# Patient Record
Sex: Female | Born: 1940 | Race: White | Hispanic: No | Smoking: Current every day smoker
Health system: Southern US, Community
[De-identification: ages and names within clinical notes are randomized; demographics above are authoritative.]

## PROBLEM LIST (undated history)

## (undated) DIAGNOSIS — R413 Other amnesia: Secondary | ICD-10-CM

## (undated) DIAGNOSIS — S42009A Fracture of unspecified part of unspecified clavicle, initial encounter for closed fracture: Secondary | ICD-10-CM

## (undated) DIAGNOSIS — W19XXXA Unspecified fall, initial encounter: Secondary | ICD-10-CM

## (undated) HISTORY — DX: Fracture of unspecified part of unspecified clavicle, initial encounter for closed fracture: S42.009A

## (undated) HISTORY — DX: Unspecified fall, initial encounter: W19.XXXA

## (undated) HISTORY — PX: TUBAL LIGATION: SHX77

## (undated) HISTORY — DX: Other amnesia: R41.3

## (undated) HISTORY — PX: WRIST FRACTURE SURGERY: SHX121

---

## 2005-10-26 ENCOUNTER — Ambulatory Visit (HOSPITAL_BASED_OUTPATIENT_CLINIC_OR_DEPARTMENT_OTHER): Admission: RE | Admit: 2005-10-26 | Discharge: 2005-10-26 | Payer: Self-pay | Admitting: *Deleted

## 2011-03-07 DIAGNOSIS — L408 Other psoriasis: Secondary | ICD-10-CM | POA: Diagnosis not present

## 2011-03-07 DIAGNOSIS — IMO0002 Reserved for concepts with insufficient information to code with codable children: Secondary | ICD-10-CM | POA: Diagnosis not present

## 2011-03-07 DIAGNOSIS — F17200 Nicotine dependence, unspecified, uncomplicated: Secondary | ICD-10-CM | POA: Diagnosis not present

## 2011-03-07 DIAGNOSIS — F411 Generalized anxiety disorder: Secondary | ICD-10-CM | POA: Diagnosis not present

## 2011-05-17 DIAGNOSIS — R928 Other abnormal and inconclusive findings on diagnostic imaging of breast: Secondary | ICD-10-CM | POA: Diagnosis not present

## 2011-11-15 DIAGNOSIS — IMO0002 Reserved for concepts with insufficient information to code with codable children: Secondary | ICD-10-CM | POA: Diagnosis not present

## 2011-11-15 DIAGNOSIS — Z23 Encounter for immunization: Secondary | ICD-10-CM | POA: Diagnosis not present

## 2011-11-15 DIAGNOSIS — F411 Generalized anxiety disorder: Secondary | ICD-10-CM | POA: Diagnosis not present

## 2012-05-01 DIAGNOSIS — F411 Generalized anxiety disorder: Secondary | ICD-10-CM | POA: Diagnosis not present

## 2012-05-17 DIAGNOSIS — Z1231 Encounter for screening mammogram for malignant neoplasm of breast: Secondary | ICD-10-CM | POA: Diagnosis not present

## 2012-08-17 DIAGNOSIS — F17200 Nicotine dependence, unspecified, uncomplicated: Secondary | ICD-10-CM | POA: Diagnosis not present

## 2012-08-17 DIAGNOSIS — S91009A Unspecified open wound, unspecified ankle, initial encounter: Secondary | ICD-10-CM | POA: Diagnosis not present

## 2012-08-17 DIAGNOSIS — Z79899 Other long term (current) drug therapy: Secondary | ICD-10-CM | POA: Diagnosis not present

## 2012-08-17 DIAGNOSIS — S81009A Unspecified open wound, unspecified knee, initial encounter: Secondary | ICD-10-CM | POA: Diagnosis not present

## 2012-11-07 DIAGNOSIS — F411 Generalized anxiety disorder: Secondary | ICD-10-CM | POA: Diagnosis not present

## 2012-11-07 DIAGNOSIS — Z23 Encounter for immunization: Secondary | ICD-10-CM | POA: Diagnosis not present

## 2012-11-07 DIAGNOSIS — F17200 Nicotine dependence, unspecified, uncomplicated: Secondary | ICD-10-CM | POA: Diagnosis not present

## 2013-01-21 DIAGNOSIS — F17200 Nicotine dependence, unspecified, uncomplicated: Secondary | ICD-10-CM | POA: Diagnosis not present

## 2013-01-21 DIAGNOSIS — G4700 Insomnia, unspecified: Secondary | ICD-10-CM | POA: Diagnosis not present

## 2013-01-21 DIAGNOSIS — F411 Generalized anxiety disorder: Secondary | ICD-10-CM | POA: Diagnosis not present

## 2013-01-21 DIAGNOSIS — R3915 Urgency of urination: Secondary | ICD-10-CM | POA: Diagnosis not present

## 2013-04-23 DIAGNOSIS — F17200 Nicotine dependence, unspecified, uncomplicated: Secondary | ICD-10-CM | POA: Diagnosis not present

## 2013-04-23 DIAGNOSIS — IMO0002 Reserved for concepts with insufficient information to code with codable children: Secondary | ICD-10-CM | POA: Diagnosis not present

## 2013-04-23 DIAGNOSIS — F411 Generalized anxiety disorder: Secondary | ICD-10-CM | POA: Diagnosis not present

## 2013-06-12 DIAGNOSIS — Z1231 Encounter for screening mammogram for malignant neoplasm of breast: Secondary | ICD-10-CM | POA: Diagnosis not present

## 2013-06-25 DIAGNOSIS — L408 Other psoriasis: Secondary | ICD-10-CM | POA: Diagnosis not present

## 2013-08-27 DIAGNOSIS — IMO0002 Reserved for concepts with insufficient information to code with codable children: Secondary | ICD-10-CM | POA: Diagnosis not present

## 2013-08-27 DIAGNOSIS — G4700 Insomnia, unspecified: Secondary | ICD-10-CM | POA: Diagnosis not present

## 2013-08-27 DIAGNOSIS — F17200 Nicotine dependence, unspecified, uncomplicated: Secondary | ICD-10-CM | POA: Diagnosis not present

## 2013-08-27 DIAGNOSIS — L408 Other psoriasis: Secondary | ICD-10-CM | POA: Diagnosis not present

## 2013-08-27 DIAGNOSIS — N3000 Acute cystitis without hematuria: Secondary | ICD-10-CM | POA: Diagnosis not present

## 2013-08-27 DIAGNOSIS — F411 Generalized anxiety disorder: Secondary | ICD-10-CM | POA: Diagnosis not present

## 2013-09-03 DIAGNOSIS — G4700 Insomnia, unspecified: Secondary | ICD-10-CM | POA: Diagnosis not present

## 2013-09-03 DIAGNOSIS — N189 Chronic kidney disease, unspecified: Secondary | ICD-10-CM | POA: Diagnosis not present

## 2013-09-03 DIAGNOSIS — F411 Generalized anxiety disorder: Secondary | ICD-10-CM | POA: Diagnosis not present

## 2013-09-03 DIAGNOSIS — F17200 Nicotine dependence, unspecified, uncomplicated: Secondary | ICD-10-CM | POA: Diagnosis not present

## 2014-01-22 DIAGNOSIS — Z1389 Encounter for screening for other disorder: Secondary | ICD-10-CM | POA: Diagnosis not present

## 2014-01-22 DIAGNOSIS — F324 Major depressive disorder, single episode, in partial remission: Secondary | ICD-10-CM | POA: Diagnosis not present

## 2014-01-22 DIAGNOSIS — F411 Generalized anxiety disorder: Secondary | ICD-10-CM | POA: Diagnosis not present

## 2014-01-22 DIAGNOSIS — Z720 Tobacco use: Secondary | ICD-10-CM | POA: Diagnosis not present

## 2014-01-22 DIAGNOSIS — F5101 Primary insomnia: Secondary | ICD-10-CM | POA: Diagnosis not present

## 2014-05-22 DIAGNOSIS — F324 Major depressive disorder, single episode, in partial remission: Secondary | ICD-10-CM | POA: Diagnosis not present

## 2014-05-22 DIAGNOSIS — F411 Generalized anxiety disorder: Secondary | ICD-10-CM | POA: Diagnosis not present

## 2014-05-22 DIAGNOSIS — F5101 Primary insomnia: Secondary | ICD-10-CM | POA: Diagnosis not present

## 2014-05-22 DIAGNOSIS — Z23 Encounter for immunization: Secondary | ICD-10-CM | POA: Diagnosis not present

## 2014-06-16 DIAGNOSIS — Z1231 Encounter for screening mammogram for malignant neoplasm of breast: Secondary | ICD-10-CM | POA: Diagnosis not present

## 2014-11-05 DIAGNOSIS — Z23 Encounter for immunization: Secondary | ICD-10-CM | POA: Diagnosis not present

## 2014-11-05 DIAGNOSIS — F5101 Primary insomnia: Secondary | ICD-10-CM | POA: Diagnosis not present

## 2014-11-05 DIAGNOSIS — F411 Generalized anxiety disorder: Secondary | ICD-10-CM | POA: Diagnosis not present

## 2014-11-05 DIAGNOSIS — F324 Major depressive disorder, single episode, in partial remission: Secondary | ICD-10-CM | POA: Diagnosis not present

## 2015-01-02 DIAGNOSIS — S6991XA Unspecified injury of right wrist, hand and finger(s), initial encounter: Secondary | ICD-10-CM | POA: Diagnosis not present

## 2015-01-02 DIAGNOSIS — S62396A Other fracture of fifth metacarpal bone, right hand, initial encounter for closed fracture: Secondary | ICD-10-CM | POA: Diagnosis not present

## 2015-01-02 DIAGNOSIS — S62306A Unspecified fracture of fifth metacarpal bone, right hand, initial encounter for closed fracture: Secondary | ICD-10-CM | POA: Diagnosis not present

## 2015-01-02 DIAGNOSIS — X58XXXA Exposure to other specified factors, initial encounter: Secondary | ICD-10-CM | POA: Diagnosis not present

## 2015-01-02 DIAGNOSIS — F17200 Nicotine dependence, unspecified, uncomplicated: Secondary | ICD-10-CM | POA: Diagnosis not present

## 2015-01-02 DIAGNOSIS — M79641 Pain in right hand: Secondary | ICD-10-CM | POA: Diagnosis not present

## 2015-01-09 DIAGNOSIS — S62356A Nondisplaced fracture of shaft of fifth metacarpal bone, right hand, initial encounter for closed fracture: Secondary | ICD-10-CM | POA: Diagnosis not present

## 2015-01-30 DIAGNOSIS — S62356A Nondisplaced fracture of shaft of fifth metacarpal bone, right hand, initial encounter for closed fracture: Secondary | ICD-10-CM | POA: Diagnosis not present

## 2015-04-24 DIAGNOSIS — F411 Generalized anxiety disorder: Secondary | ICD-10-CM | POA: Diagnosis not present

## 2015-04-24 DIAGNOSIS — F324 Major depressive disorder, single episode, in partial remission: Secondary | ICD-10-CM | POA: Diagnosis not present

## 2015-04-24 DIAGNOSIS — N189 Chronic kidney disease, unspecified: Secondary | ICD-10-CM | POA: Diagnosis not present

## 2015-04-24 DIAGNOSIS — Z720 Tobacco use: Secondary | ICD-10-CM | POA: Diagnosis not present

## 2015-04-27 DIAGNOSIS — F5101 Primary insomnia: Secondary | ICD-10-CM | POA: Diagnosis not present

## 2015-04-27 DIAGNOSIS — F324 Major depressive disorder, single episode, in partial remission: Secondary | ICD-10-CM | POA: Diagnosis not present

## 2015-04-27 DIAGNOSIS — F411 Generalized anxiety disorder: Secondary | ICD-10-CM | POA: Diagnosis not present

## 2015-10-16 ENCOUNTER — Other Ambulatory Visit: Payer: Self-pay

## 2015-11-05 DIAGNOSIS — Z681 Body mass index (BMI) 19 or less, adult: Secondary | ICD-10-CM | POA: Diagnosis not present

## 2015-11-05 DIAGNOSIS — F324 Major depressive disorder, single episode, in partial remission: Secondary | ICD-10-CM | POA: Diagnosis not present

## 2015-11-05 DIAGNOSIS — Z23 Encounter for immunization: Secondary | ICD-10-CM | POA: Diagnosis not present

## 2015-11-05 DIAGNOSIS — F5101 Primary insomnia: Secondary | ICD-10-CM | POA: Diagnosis not present

## 2015-11-05 DIAGNOSIS — F411 Generalized anxiety disorder: Secondary | ICD-10-CM | POA: Diagnosis not present

## 2016-03-01 DIAGNOSIS — F0390 Unspecified dementia without behavioral disturbance: Secondary | ICD-10-CM | POA: Diagnosis not present

## 2016-03-01 DIAGNOSIS — Z681 Body mass index (BMI) 19 or less, adult: Secondary | ICD-10-CM | POA: Diagnosis not present

## 2016-03-01 DIAGNOSIS — E441 Mild protein-calorie malnutrition: Secondary | ICD-10-CM | POA: Diagnosis not present

## 2016-04-29 DIAGNOSIS — R918 Other nonspecific abnormal finding of lung field: Secondary | ICD-10-CM | POA: Diagnosis not present

## 2016-04-29 DIAGNOSIS — G9341 Metabolic encephalopathy: Secondary | ICD-10-CM | POA: Diagnosis not present

## 2016-04-29 DIAGNOSIS — Z886 Allergy status to analgesic agent status: Secondary | ICD-10-CM | POA: Diagnosis not present

## 2016-04-29 DIAGNOSIS — R410 Disorientation, unspecified: Secondary | ICD-10-CM | POA: Diagnosis not present

## 2016-04-29 DIAGNOSIS — R319 Hematuria, unspecified: Secondary | ICD-10-CM | POA: Diagnosis not present

## 2016-04-29 DIAGNOSIS — B9620 Unspecified Escherichia coli [E. coli] as the cause of diseases classified elsewhere: Secondary | ICD-10-CM | POA: Diagnosis present

## 2016-04-29 DIAGNOSIS — F0281 Dementia in other diseases classified elsewhere with behavioral disturbance: Secondary | ICD-10-CM | POA: Diagnosis not present

## 2016-04-29 DIAGNOSIS — E860 Dehydration: Secondary | ICD-10-CM | POA: Diagnosis not present

## 2016-04-29 DIAGNOSIS — Z885 Allergy status to narcotic agent status: Secondary | ICD-10-CM | POA: Diagnosis not present

## 2016-04-29 DIAGNOSIS — F0390 Unspecified dementia without behavioral disturbance: Secondary | ICD-10-CM | POA: Diagnosis present

## 2016-04-29 DIAGNOSIS — F05 Delirium due to known physiological condition: Secondary | ICD-10-CM | POA: Diagnosis not present

## 2016-04-29 DIAGNOSIS — N390 Urinary tract infection, site not specified: Secondary | ICD-10-CM | POA: Diagnosis not present

## 2016-04-29 DIAGNOSIS — R0902 Hypoxemia: Secondary | ICD-10-CM | POA: Diagnosis not present

## 2016-05-08 DIAGNOSIS — G9341 Metabolic encephalopathy: Secondary | ICD-10-CM | POA: Diagnosis not present

## 2016-05-08 DIAGNOSIS — F0390 Unspecified dementia without behavioral disturbance: Secondary | ICD-10-CM | POA: Diagnosis not present

## 2016-05-08 DIAGNOSIS — E860 Dehydration: Secondary | ICD-10-CM | POA: Diagnosis not present

## 2016-05-08 DIAGNOSIS — N390 Urinary tract infection, site not specified: Secondary | ICD-10-CM | POA: Diagnosis not present

## 2016-05-08 DIAGNOSIS — R2689 Other abnormalities of gait and mobility: Secondary | ICD-10-CM | POA: Diagnosis not present

## 2016-05-10 DIAGNOSIS — F0390 Unspecified dementia without behavioral disturbance: Secondary | ICD-10-CM | POA: Diagnosis not present

## 2016-05-10 DIAGNOSIS — N390 Urinary tract infection, site not specified: Secondary | ICD-10-CM | POA: Diagnosis not present

## 2016-05-10 DIAGNOSIS — R2689 Other abnormalities of gait and mobility: Secondary | ICD-10-CM | POA: Diagnosis not present

## 2016-05-10 DIAGNOSIS — G9341 Metabolic encephalopathy: Secondary | ICD-10-CM | POA: Diagnosis not present

## 2016-05-10 DIAGNOSIS — E860 Dehydration: Secondary | ICD-10-CM | POA: Diagnosis not present

## 2016-05-11 DIAGNOSIS — F0390 Unspecified dementia without behavioral disturbance: Secondary | ICD-10-CM | POA: Diagnosis not present

## 2016-05-11 DIAGNOSIS — G9341 Metabolic encephalopathy: Secondary | ICD-10-CM | POA: Diagnosis not present

## 2016-05-11 DIAGNOSIS — R2689 Other abnormalities of gait and mobility: Secondary | ICD-10-CM | POA: Diagnosis not present

## 2016-05-11 DIAGNOSIS — E860 Dehydration: Secondary | ICD-10-CM | POA: Diagnosis not present

## 2016-05-11 DIAGNOSIS — N390 Urinary tract infection, site not specified: Secondary | ICD-10-CM | POA: Diagnosis not present

## 2016-05-12 DIAGNOSIS — F0390 Unspecified dementia without behavioral disturbance: Secondary | ICD-10-CM | POA: Diagnosis not present

## 2016-05-12 DIAGNOSIS — R2689 Other abnormalities of gait and mobility: Secondary | ICD-10-CM | POA: Diagnosis not present

## 2016-05-12 DIAGNOSIS — N390 Urinary tract infection, site not specified: Secondary | ICD-10-CM | POA: Diagnosis not present

## 2016-05-12 DIAGNOSIS — E860 Dehydration: Secondary | ICD-10-CM | POA: Diagnosis not present

## 2016-05-12 DIAGNOSIS — G9341 Metabolic encephalopathy: Secondary | ICD-10-CM | POA: Diagnosis not present

## 2016-05-13 DIAGNOSIS — N390 Urinary tract infection, site not specified: Secondary | ICD-10-CM | POA: Diagnosis not present

## 2016-05-13 DIAGNOSIS — F0390 Unspecified dementia without behavioral disturbance: Secondary | ICD-10-CM | POA: Diagnosis not present

## 2016-05-13 DIAGNOSIS — R413 Other amnesia: Secondary | ICD-10-CM | POA: Diagnosis not present

## 2016-05-13 DIAGNOSIS — R2689 Other abnormalities of gait and mobility: Secondary | ICD-10-CM | POA: Diagnosis not present

## 2016-05-13 DIAGNOSIS — G9389 Other specified disorders of brain: Secondary | ICD-10-CM | POA: Diagnosis not present

## 2016-05-13 DIAGNOSIS — G9341 Metabolic encephalopathy: Secondary | ICD-10-CM | POA: Diagnosis not present

## 2016-05-13 DIAGNOSIS — R410 Disorientation, unspecified: Secondary | ICD-10-CM | POA: Diagnosis not present

## 2016-05-13 DIAGNOSIS — E860 Dehydration: Secondary | ICD-10-CM | POA: Diagnosis not present

## 2016-05-17 DIAGNOSIS — E860 Dehydration: Secondary | ICD-10-CM | POA: Diagnosis not present

## 2016-05-17 DIAGNOSIS — F0390 Unspecified dementia without behavioral disturbance: Secondary | ICD-10-CM | POA: Diagnosis not present

## 2016-05-17 DIAGNOSIS — G9341 Metabolic encephalopathy: Secondary | ICD-10-CM | POA: Diagnosis not present

## 2016-05-17 DIAGNOSIS — R2689 Other abnormalities of gait and mobility: Secondary | ICD-10-CM | POA: Diagnosis not present

## 2016-05-17 DIAGNOSIS — N390 Urinary tract infection, site not specified: Secondary | ICD-10-CM | POA: Diagnosis not present

## 2016-05-18 DIAGNOSIS — E860 Dehydration: Secondary | ICD-10-CM | POA: Diagnosis not present

## 2016-05-18 DIAGNOSIS — F0390 Unspecified dementia without behavioral disturbance: Secondary | ICD-10-CM | POA: Diagnosis not present

## 2016-05-18 DIAGNOSIS — G9341 Metabolic encephalopathy: Secondary | ICD-10-CM | POA: Diagnosis not present

## 2016-05-18 DIAGNOSIS — R2689 Other abnormalities of gait and mobility: Secondary | ICD-10-CM | POA: Diagnosis not present

## 2016-05-18 DIAGNOSIS — N390 Urinary tract infection, site not specified: Secondary | ICD-10-CM | POA: Diagnosis not present

## 2016-05-19 DIAGNOSIS — R2689 Other abnormalities of gait and mobility: Secondary | ICD-10-CM | POA: Diagnosis not present

## 2016-05-19 DIAGNOSIS — Z681 Body mass index (BMI) 19 or less, adult: Secondary | ICD-10-CM | POA: Diagnosis not present

## 2016-05-19 DIAGNOSIS — N390 Urinary tract infection, site not specified: Secondary | ICD-10-CM | POA: Diagnosis not present

## 2016-05-19 DIAGNOSIS — F0281 Dementia in other diseases classified elsewhere with behavioral disturbance: Secondary | ICD-10-CM | POA: Diagnosis not present

## 2016-05-19 DIAGNOSIS — K5901 Slow transit constipation: Secondary | ICD-10-CM | POA: Diagnosis not present

## 2016-05-19 DIAGNOSIS — G9341 Metabolic encephalopathy: Secondary | ICD-10-CM | POA: Diagnosis not present

## 2016-05-19 DIAGNOSIS — G912 (Idiopathic) normal pressure hydrocephalus: Secondary | ICD-10-CM | POA: Diagnosis not present

## 2016-05-19 DIAGNOSIS — F0390 Unspecified dementia without behavioral disturbance: Secondary | ICD-10-CM | POA: Diagnosis not present

## 2016-05-19 DIAGNOSIS — E860 Dehydration: Secondary | ICD-10-CM | POA: Diagnosis not present

## 2016-05-19 DIAGNOSIS — F5101 Primary insomnia: Secondary | ICD-10-CM | POA: Diagnosis not present

## 2016-05-19 DIAGNOSIS — F411 Generalized anxiety disorder: Secondary | ICD-10-CM | POA: Diagnosis not present

## 2016-05-20 DIAGNOSIS — F0390 Unspecified dementia without behavioral disturbance: Secondary | ICD-10-CM | POA: Diagnosis not present

## 2016-05-20 DIAGNOSIS — R2689 Other abnormalities of gait and mobility: Secondary | ICD-10-CM | POA: Diagnosis not present

## 2016-05-20 DIAGNOSIS — N390 Urinary tract infection, site not specified: Secondary | ICD-10-CM | POA: Diagnosis not present

## 2016-05-20 DIAGNOSIS — G9341 Metabolic encephalopathy: Secondary | ICD-10-CM | POA: Diagnosis not present

## 2016-05-20 DIAGNOSIS — E860 Dehydration: Secondary | ICD-10-CM | POA: Diagnosis not present

## 2016-05-23 DIAGNOSIS — N390 Urinary tract infection, site not specified: Secondary | ICD-10-CM | POA: Diagnosis not present

## 2016-05-23 DIAGNOSIS — R2689 Other abnormalities of gait and mobility: Secondary | ICD-10-CM | POA: Diagnosis not present

## 2016-05-23 DIAGNOSIS — F0390 Unspecified dementia without behavioral disturbance: Secondary | ICD-10-CM | POA: Diagnosis not present

## 2016-05-23 DIAGNOSIS — G9341 Metabolic encephalopathy: Secondary | ICD-10-CM | POA: Diagnosis not present

## 2016-05-23 DIAGNOSIS — E860 Dehydration: Secondary | ICD-10-CM | POA: Diagnosis not present

## 2016-05-24 DIAGNOSIS — E860 Dehydration: Secondary | ICD-10-CM | POA: Diagnosis not present

## 2016-05-24 DIAGNOSIS — F0390 Unspecified dementia without behavioral disturbance: Secondary | ICD-10-CM | POA: Diagnosis not present

## 2016-05-24 DIAGNOSIS — R2689 Other abnormalities of gait and mobility: Secondary | ICD-10-CM | POA: Diagnosis not present

## 2016-05-24 DIAGNOSIS — G9341 Metabolic encephalopathy: Secondary | ICD-10-CM | POA: Diagnosis not present

## 2016-05-24 DIAGNOSIS — N390 Urinary tract infection, site not specified: Secondary | ICD-10-CM | POA: Diagnosis not present

## 2016-05-25 DIAGNOSIS — G9341 Metabolic encephalopathy: Secondary | ICD-10-CM | POA: Diagnosis not present

## 2016-05-25 DIAGNOSIS — R2689 Other abnormalities of gait and mobility: Secondary | ICD-10-CM | POA: Diagnosis not present

## 2016-05-25 DIAGNOSIS — F0390 Unspecified dementia without behavioral disturbance: Secondary | ICD-10-CM | POA: Diagnosis not present

## 2016-05-25 DIAGNOSIS — N390 Urinary tract infection, site not specified: Secondary | ICD-10-CM | POA: Diagnosis not present

## 2016-05-25 DIAGNOSIS — E860 Dehydration: Secondary | ICD-10-CM | POA: Diagnosis not present

## 2016-05-26 DIAGNOSIS — N390 Urinary tract infection, site not specified: Secondary | ICD-10-CM | POA: Diagnosis not present

## 2016-05-26 DIAGNOSIS — R2689 Other abnormalities of gait and mobility: Secondary | ICD-10-CM | POA: Diagnosis not present

## 2016-05-26 DIAGNOSIS — F0390 Unspecified dementia without behavioral disturbance: Secondary | ICD-10-CM | POA: Diagnosis not present

## 2016-05-26 DIAGNOSIS — E860 Dehydration: Secondary | ICD-10-CM | POA: Diagnosis not present

## 2016-05-26 DIAGNOSIS — G9341 Metabolic encephalopathy: Secondary | ICD-10-CM | POA: Diagnosis not present

## 2016-05-27 DIAGNOSIS — M19041 Primary osteoarthritis, right hand: Secondary | ICD-10-CM | POA: Diagnosis not present

## 2016-05-27 DIAGNOSIS — G9341 Metabolic encephalopathy: Secondary | ICD-10-CM | POA: Diagnosis not present

## 2016-05-27 DIAGNOSIS — E860 Dehydration: Secondary | ICD-10-CM | POA: Diagnosis not present

## 2016-05-27 DIAGNOSIS — F0390 Unspecified dementia without behavioral disturbance: Secondary | ICD-10-CM | POA: Diagnosis not present

## 2016-05-27 DIAGNOSIS — N390 Urinary tract infection, site not specified: Secondary | ICD-10-CM | POA: Diagnosis not present

## 2016-05-27 DIAGNOSIS — F17200 Nicotine dependence, unspecified, uncomplicated: Secondary | ICD-10-CM | POA: Diagnosis not present

## 2016-05-27 DIAGNOSIS — R2689 Other abnormalities of gait and mobility: Secondary | ICD-10-CM | POA: Diagnosis not present

## 2016-05-27 DIAGNOSIS — Z79899 Other long term (current) drug therapy: Secondary | ICD-10-CM | POA: Diagnosis not present

## 2016-05-27 DIAGNOSIS — W228XXA Striking against or struck by other objects, initial encounter: Secondary | ICD-10-CM | POA: Diagnosis not present

## 2016-05-27 DIAGNOSIS — S60051A Contusion of right little finger without damage to nail, initial encounter: Secondary | ICD-10-CM | POA: Diagnosis not present

## 2016-05-30 DIAGNOSIS — G9341 Metabolic encephalopathy: Secondary | ICD-10-CM | POA: Diagnosis not present

## 2016-05-30 DIAGNOSIS — R2689 Other abnormalities of gait and mobility: Secondary | ICD-10-CM | POA: Diagnosis not present

## 2016-05-30 DIAGNOSIS — F0390 Unspecified dementia without behavioral disturbance: Secondary | ICD-10-CM | POA: Diagnosis not present

## 2016-05-30 DIAGNOSIS — E860 Dehydration: Secondary | ICD-10-CM | POA: Diagnosis not present

## 2016-05-30 DIAGNOSIS — N390 Urinary tract infection, site not specified: Secondary | ICD-10-CM | POA: Diagnosis not present

## 2016-05-31 DIAGNOSIS — F0390 Unspecified dementia without behavioral disturbance: Secondary | ICD-10-CM | POA: Diagnosis not present

## 2016-05-31 DIAGNOSIS — R2689 Other abnormalities of gait and mobility: Secondary | ICD-10-CM | POA: Diagnosis not present

## 2016-05-31 DIAGNOSIS — G9341 Metabolic encephalopathy: Secondary | ICD-10-CM | POA: Diagnosis not present

## 2016-05-31 DIAGNOSIS — E860 Dehydration: Secondary | ICD-10-CM | POA: Diagnosis not present

## 2016-05-31 DIAGNOSIS — N390 Urinary tract infection, site not specified: Secondary | ICD-10-CM | POA: Diagnosis not present

## 2016-06-02 ENCOUNTER — Ambulatory Visit: Payer: Medicare Other | Admitting: Neurology

## 2016-06-02 DIAGNOSIS — I6789 Other cerebrovascular disease: Secondary | ICD-10-CM | POA: Diagnosis not present

## 2016-06-02 DIAGNOSIS — R9431 Abnormal electrocardiogram [ECG] [EKG]: Secondary | ICD-10-CM | POA: Diagnosis not present

## 2016-06-02 DIAGNOSIS — R4182 Altered mental status, unspecified: Secondary | ICD-10-CM | POA: Diagnosis not present

## 2016-06-02 DIAGNOSIS — G9341 Metabolic encephalopathy: Secondary | ICD-10-CM | POA: Diagnosis not present

## 2016-06-02 DIAGNOSIS — F4489 Other dissociative and conversion disorders: Secondary | ICD-10-CM | POA: Diagnosis not present

## 2016-06-02 DIAGNOSIS — E860 Dehydration: Secondary | ICD-10-CM | POA: Diagnosis not present

## 2016-06-02 DIAGNOSIS — N390 Urinary tract infection, site not specified: Secondary | ICD-10-CM | POA: Diagnosis not present

## 2016-06-02 DIAGNOSIS — F0390 Unspecified dementia without behavioral disturbance: Secondary | ICD-10-CM | POA: Diagnosis not present

## 2016-06-02 DIAGNOSIS — R2689 Other abnormalities of gait and mobility: Secondary | ICD-10-CM | POA: Diagnosis not present

## 2016-06-02 DIAGNOSIS — F17200 Nicotine dependence, unspecified, uncomplicated: Secondary | ICD-10-CM | POA: Diagnosis not present

## 2016-06-02 DIAGNOSIS — S0990XA Unspecified injury of head, initial encounter: Secondary | ICD-10-CM | POA: Diagnosis not present

## 2016-06-02 DIAGNOSIS — Z79899 Other long term (current) drug therapy: Secondary | ICD-10-CM | POA: Diagnosis not present

## 2016-06-05 DIAGNOSIS — G301 Alzheimer's disease with late onset: Secondary | ICD-10-CM | POA: Diagnosis not present

## 2016-06-05 DIAGNOSIS — F05 Delirium due to known physiological condition: Secondary | ICD-10-CM | POA: Diagnosis not present

## 2016-06-05 DIAGNOSIS — Z681 Body mass index (BMI) 19 or less, adult: Secondary | ICD-10-CM | POA: Diagnosis not present

## 2016-06-06 DIAGNOSIS — R2689 Other abnormalities of gait and mobility: Secondary | ICD-10-CM | POA: Diagnosis not present

## 2016-06-06 DIAGNOSIS — N390 Urinary tract infection, site not specified: Secondary | ICD-10-CM | POA: Diagnosis not present

## 2016-06-06 DIAGNOSIS — E860 Dehydration: Secondary | ICD-10-CM | POA: Diagnosis not present

## 2016-06-06 DIAGNOSIS — F0390 Unspecified dementia without behavioral disturbance: Secondary | ICD-10-CM | POA: Diagnosis not present

## 2016-06-06 DIAGNOSIS — G9341 Metabolic encephalopathy: Secondary | ICD-10-CM | POA: Diagnosis not present

## 2016-06-07 ENCOUNTER — Ambulatory Visit: Payer: Medicare Other | Admitting: Neurology

## 2016-06-15 DIAGNOSIS — E860 Dehydration: Secondary | ICD-10-CM | POA: Diagnosis not present

## 2016-06-15 DIAGNOSIS — F0390 Unspecified dementia without behavioral disturbance: Secondary | ICD-10-CM | POA: Diagnosis not present

## 2016-06-15 DIAGNOSIS — R2689 Other abnormalities of gait and mobility: Secondary | ICD-10-CM | POA: Diagnosis not present

## 2016-06-15 DIAGNOSIS — N390 Urinary tract infection, site not specified: Secondary | ICD-10-CM | POA: Diagnosis not present

## 2016-06-15 DIAGNOSIS — G9341 Metabolic encephalopathy: Secondary | ICD-10-CM | POA: Diagnosis not present

## 2016-06-17 DIAGNOSIS — N390 Urinary tract infection, site not specified: Secondary | ICD-10-CM | POA: Diagnosis not present

## 2016-06-17 DIAGNOSIS — E860 Dehydration: Secondary | ICD-10-CM | POA: Diagnosis not present

## 2016-06-17 DIAGNOSIS — R2689 Other abnormalities of gait and mobility: Secondary | ICD-10-CM | POA: Diagnosis not present

## 2016-06-17 DIAGNOSIS — F0390 Unspecified dementia without behavioral disturbance: Secondary | ICD-10-CM | POA: Diagnosis not present

## 2016-06-17 DIAGNOSIS — G9341 Metabolic encephalopathy: Secondary | ICD-10-CM | POA: Diagnosis not present

## 2016-06-21 ENCOUNTER — Encounter (INDEPENDENT_AMBULATORY_CARE_PROVIDER_SITE_OTHER): Payer: Self-pay

## 2016-06-21 ENCOUNTER — Ambulatory Visit (INDEPENDENT_AMBULATORY_CARE_PROVIDER_SITE_OTHER): Payer: Medicare Other | Admitting: Neurology

## 2016-06-21 ENCOUNTER — Encounter: Payer: Self-pay | Admitting: Neurology

## 2016-06-21 VITALS — BP 138/73 | HR 72 | Ht 62.0 in | Wt 108.6 lb

## 2016-06-21 DIAGNOSIS — G912 (Idiopathic) normal pressure hydrocephalus: Secondary | ICD-10-CM | POA: Diagnosis not present

## 2016-06-21 NOTE — Progress Notes (Signed)
[facility]    Provider:  Dr Jaynee Eagles Referring Provider: Manon Hilding, MD Primary Care Physician:  Manon Hilding, MD  CC:  Dementia  HPI:  Debbie Hawkins is a 76 y.o. female here as a referral from Dr. Quintin Alto for dementia. MRI of the brain showed marketed ventriculomegaly. She is on Aricept. She is a current every day smoker. Past medical history of anxiety and depression.Patient is here for son and daughter. Patient says she can't remember things doesn't know when it started. She can't give anymore details. Here with a friend and her son. Her gait is impaired she has difficulty going up steps. She tells stories of things that did not happen. She gets people mixed up, she thought her primary doctor was a Environmental education officer. She often mistakes her son for her brother. She has periods of agitation, being mean. Symptoms started a year ago, progressive. She went to the hospital with a UTI 2 months ago and since then worse. She wsa very agitated in the hospital and needed sedation or a 24x7 sitter. They started Geodon in the hospital before she had the MRI. She can sign her name to a check but can't manage finances.  She lives in a home. Son is there all day long. She has difficulty using the microwave and seeing the numbers.  She has delusions, she thinks someone else is living with them. She obsesses over the dog. She has difficulty with the date, repeating the same stories or questions in 5 minutes. She thinks she has to perform tasks   Reviewed notes, labs and imaging from outside physicians, which showed:  Reviewed MRI report and also images brought on CD by family and agree with the following. No acute stroke, acute hemorrhage, mass lesion, or extra-axial fluid. Marked ventriculomegaly, particularly affecting the lateral and third ventricles. His disproportionate lack of enlargement of the cortical sulci. Normal pressure hydrocephalus is not excluded. In addition to the findings which are  consistent with chronic microvascular ischemic change, there is fairly continuous T2 FLAIR hyperintensity surrounding the ventricles which could represent transependymal absorption. Partially empty sella. Impression marked ventriculomegaly could represent normal pressure hydrocephalus. Chronic microvascular ischemic changes may be superimposed. There is a possibility of cortical atrophy. Consider lumbar puncture for further evaluation.  CMP 03/01/2016 with BUN 12 and creatinine 1.07, EGFR slightly reduced. CBC was unremarkable. TSH 3.2. RPR nonreactive. B12 and folate normal, B12 686.  Reviewed notes from [facility]. It appears as though she was admitted for Escherichia coli UTI,   Review of Systems: Patient complains of symptoms per HPI as well as the following symptoms: memory changes, confusion. Pertinent negatives per HPI. All others negative.   Social History   Social History  . Marital status: Divorced    Spouse name: N/A  . Number of children: 2  . Years of education: Nursing school   Occupational History  . Retired    Social History Main Topics  . Smoking status: Current Every Day Smoker    Packs/day: 1.50  . Smokeless tobacco: Current User  . Alcohol use No  . Drug use: No  . Sexual activity: Not on file   Other Topics Concern  . Not on file   Social History Narrative   Lives w/ her son   Caffeine: 2 cups per day    Family History  Problem Relation Age of Onset  . Breast cancer Mother   . Stroke Father   . Dementia Neg Hx  Past Medical History:  Diagnosis Date  . Memory loss     Past Surgical History:  Procedure Laterality Date  . TUBAL LIGATION      Current Outpatient Prescriptions  Medication Sig Dispense Refill  . amitriptyline (ELAVIL) 25 MG tablet Take 25 mg by mouth at bedtime.  3  . donepezil (ARICEPT) 10 MG tablet Take 10 mg by mouth daily.  6  . ziprasidone (GEODON) 20 MG capsule Take 20 mg by mouth 2 (two) times daily.  0     No current facility-administered medications for this visit.     Allergies as of 06/21/2016  . (No Known Allergies)    Vitals: BP 138/73   Pulse 72   Ht 5' 2"  (1.575 m)   Wt 108 lb 9.6 oz (49.3 kg)   BMI 19.86 kg/m  Last Weight:  Wt Readings from Last 1 Encounters:  06/21/16 108 lb 9.6 oz (49.3 kg)   Last Height:   Ht Readings from Last 1 Encounters:  06/21/16 5' 2"  (1.575 m)   Physical exam: Exam: Gen: NAD, not converant          CV: RRR, no MRG. No Carotid Bruits. No peripheral edema, warm, nontender Eyes: Conjunctivae clear without exudates or hemorrhage  Neuro: Detailed Neurologic Exam  Speech:    Speech is normal; fluent, not spontaneous with impaired comprehension.  Difficulty following simple commands Cognition:  MMSE - Mini Mental State Exam 06/21/2016  Orientation to time 3  Orientation to Place 2  Registration 3  Attention/ Calculation 4  Recall 0  Language- name 2 objects 2  Language- repeat 1  Language- follow 3 step command 2  Language- read & follow direction 0  Write a sentence 0  Copy design 0  Total score 17      The patient is oriented to person     recent and remote memory impaired;     language fluent;     Impaired attention, concentration, fund of knowledge Cranial Nerves:    The pupils are equal, round, and reactive to light. Attempted funduscopic exam could not visualize due to patient cooperation.. Visual fields are full to finger confrontation. Exotropia Extraocular movements are intact. Trigeminal sensation is intact and the muscles of mastication are normal. The face is symmetric. The palate elevates in the midline. Hearing intact. Voice is normal. Shoulder shrug is normal. The tongue has normal motion without fasciculations.   Coordination:    Bradykinetic, no dymetria noted but limited exam because she had difficulty understanding the exam   Gait:    Magnetic gait with mild stooped posture  Motor Observation:    no  involuntary movements noted. Tone:    Cogwheelig in the uppers  Posture:    Posture is mildly stooped    Strength: antigravity and equal, attempted could not perform full motor exam due to patient's inability to comprehend the instructions.      Sensation: intact to LT     Reflex Exam:  DTR's:    Deep tendon reflexes in the upper and lower extremities are brisk bilaterally.   Toes:    The toes are downgoing bilaterally.   Clonus:    Clonus is absent.    Assessment/Plan:  76 year old with memory problems, dementia and marked ventriculomegaly suspicious for NPH. Her gait is magnetic. Mini-Mental status exam 17 out of 30.  - Patient needs a high volume lumbar puncture to evaluate for NPH. I asked patient to schedule an appointment with me one or 2  days after the lumbar puncture for evaluation. Also advised the family to look for improvements in cognition and gait after high volume lumbar tap. - I would minimize use of geodon and amitriptyline, if possible, in this 76 year old patient with memory disorder. Both of these medications can cause cognitive changes especially in the elderly. Will defer to PCP. However often times neuroleptics are used in the elderly but advised that can be significant side effects, they are not FDA approved for use in the elderly, and there is a black box warning for significant morbidity or mortality in the elderly.  Lumbar puncture ([place]). Discussed risks of high volume lumbar puncture including possible herniation, headache, infection, back pain, bleeding and other possible side effects.  Cc: Dr. Wray Kearns, MD  [facility] [address] [place], [place] [address]  Phone [phone] Fax [phone]

## 2016-06-21 NOTE — Patient Instructions (Addendum)
As far as your medications are concerned, I would like to suggest: Discuss Amitriptyline with Dr. Neita Carp, this can cause confusion in the elderly and has been associated with dementia  As far as diagnostic testing: Lumbar puncture  I would like to see you back 1-2 days after lumbar puncture, sooner if we need to. Please call us with any interim questions, concerns, problems, updates or refill requests.   Our phone number is [phone]. We also have an after hours call service for urgent matters and there is a physician on-call for urgent questions. For any emergencies you know to call 911 or go to the nearest emergency room

## 2016-06-22 DIAGNOSIS — E860 Dehydration: Secondary | ICD-10-CM | POA: Diagnosis not present

## 2016-06-22 DIAGNOSIS — N390 Urinary tract infection, site not specified: Secondary | ICD-10-CM | POA: Diagnosis not present

## 2016-06-22 DIAGNOSIS — R2689 Other abnormalities of gait and mobility: Secondary | ICD-10-CM | POA: Diagnosis not present

## 2016-06-22 DIAGNOSIS — F0390 Unspecified dementia without behavioral disturbance: Secondary | ICD-10-CM | POA: Diagnosis not present

## 2016-06-22 DIAGNOSIS — G9341 Metabolic encephalopathy: Secondary | ICD-10-CM | POA: Diagnosis not present

## 2016-06-27 DIAGNOSIS — N390 Urinary tract infection, site not specified: Secondary | ICD-10-CM | POA: Diagnosis not present

## 2016-06-27 DIAGNOSIS — R2689 Other abnormalities of gait and mobility: Secondary | ICD-10-CM | POA: Diagnosis not present

## 2016-06-27 DIAGNOSIS — G9341 Metabolic encephalopathy: Secondary | ICD-10-CM | POA: Diagnosis not present

## 2016-06-27 DIAGNOSIS — E860 Dehydration: Secondary | ICD-10-CM | POA: Diagnosis not present

## 2016-06-27 DIAGNOSIS — F0390 Unspecified dementia without behavioral disturbance: Secondary | ICD-10-CM | POA: Diagnosis not present

## 2016-06-29 ENCOUNTER — Other Ambulatory Visit: Payer: Self-pay

## 2016-06-29 DIAGNOSIS — M8588 Other specified disorders of bone density and structure, other site: Secondary | ICD-10-CM | POA: Diagnosis not present

## 2016-06-29 DIAGNOSIS — S52692A Other fracture of lower end of left ulna, initial encounter for closed fracture: Secondary | ICD-10-CM | POA: Diagnosis not present

## 2016-06-29 DIAGNOSIS — F0281 Dementia in other diseases classified elsewhere with behavioral disturbance: Secondary | ICD-10-CM | POA: Diagnosis not present

## 2016-06-29 DIAGNOSIS — Z79899 Other long term (current) drug therapy: Secondary | ICD-10-CM | POA: Diagnosis not present

## 2016-06-29 DIAGNOSIS — S52502A Unspecified fracture of the lower end of left radius, initial encounter for closed fracture: Secondary | ICD-10-CM | POA: Diagnosis not present

## 2016-06-29 DIAGNOSIS — R0902 Hypoxemia: Secondary | ICD-10-CM | POA: Diagnosis not present

## 2016-06-29 DIAGNOSIS — M25532 Pain in left wrist: Secondary | ICD-10-CM | POA: Diagnosis not present

## 2016-06-29 DIAGNOSIS — F17200 Nicotine dependence, unspecified, uncomplicated: Secondary | ICD-10-CM | POA: Diagnosis not present

## 2016-06-29 DIAGNOSIS — W1839XA Other fall on same level, initial encounter: Secondary | ICD-10-CM | POA: Diagnosis not present

## 2016-06-29 DIAGNOSIS — G309 Alzheimer's disease, unspecified: Secondary | ICD-10-CM | POA: Diagnosis not present

## 2016-06-29 DIAGNOSIS — S52602A Unspecified fracture of lower end of left ulna, initial encounter for closed fracture: Secondary | ICD-10-CM | POA: Diagnosis not present

## 2016-06-29 DIAGNOSIS — S42022A Displaced fracture of shaft of left clavicle, initial encounter for closed fracture: Secondary | ICD-10-CM | POA: Diagnosis not present

## 2016-06-29 DIAGNOSIS — F0280 Dementia in other diseases classified elsewhere without behavioral disturbance: Secondary | ICD-10-CM | POA: Diagnosis not present

## 2016-06-29 DIAGNOSIS — R52 Pain, unspecified: Secondary | ICD-10-CM | POA: Diagnosis not present

## 2016-06-29 DIAGNOSIS — D72829 Elevated white blood cell count, unspecified: Secondary | ICD-10-CM | POA: Diagnosis not present

## 2016-06-29 DIAGNOSIS — J439 Emphysema, unspecified: Secondary | ICD-10-CM | POA: Diagnosis not present

## 2016-06-30 ENCOUNTER — Inpatient Hospital Stay
Admission: RE | Admit: 2016-06-30 | Discharge: 2016-06-30 | Disposition: A | Discharge status: Home / Self Care | Payer: Self-pay | Source: Ambulatory Visit | Attending: Neurology | Admitting: Neurology

## 2016-06-30 DIAGNOSIS — S42022A Displaced fracture of shaft of left clavicle, initial encounter for closed fracture: Secondary | ICD-10-CM | POA: Diagnosis not present

## 2016-06-30 DIAGNOSIS — M25532 Pain in left wrist: Secondary | ICD-10-CM | POA: Diagnosis not present

## 2016-06-30 DIAGNOSIS — S52502A Unspecified fracture of the lower end of left radius, initial encounter for closed fracture: Secondary | ICD-10-CM | POA: Diagnosis not present

## 2016-06-30 DIAGNOSIS — S52602A Unspecified fracture of lower end of left ulna, initial encounter for closed fracture: Secondary | ICD-10-CM | POA: Diagnosis not present

## 2016-06-30 NOTE — Discharge Instructions (Signed)

## 2016-07-01 DIAGNOSIS — S52602A Unspecified fracture of lower end of left ulna, initial encounter for closed fracture: Secondary | ICD-10-CM | POA: Diagnosis not present

## 2016-07-01 DIAGNOSIS — Z809 Family history of malignant neoplasm, unspecified: Secondary | ICD-10-CM | POA: Diagnosis not present

## 2016-07-01 DIAGNOSIS — F17210 Nicotine dependence, cigarettes, uncomplicated: Secondary | ICD-10-CM | POA: Diagnosis not present

## 2016-07-01 DIAGNOSIS — M1990 Unspecified osteoarthritis, unspecified site: Secondary | ICD-10-CM | POA: Diagnosis not present

## 2016-07-01 DIAGNOSIS — W19XXXA Unspecified fall, initial encounter: Secondary | ICD-10-CM | POA: Diagnosis not present

## 2016-07-01 DIAGNOSIS — Z79899 Other long term (current) drug therapy: Secondary | ICD-10-CM | POA: Diagnosis not present

## 2016-07-01 DIAGNOSIS — S52502A Unspecified fracture of the lower end of left radius, initial encounter for closed fracture: Secondary | ICD-10-CM | POA: Diagnosis not present

## 2016-07-01 DIAGNOSIS — F0390 Unspecified dementia without behavioral disturbance: Secondary | ICD-10-CM | POA: Diagnosis not present

## 2016-07-01 DIAGNOSIS — Z886 Allergy status to analgesic agent status: Secondary | ICD-10-CM | POA: Diagnosis not present

## 2016-07-01 DIAGNOSIS — F419 Anxiety disorder, unspecified: Secondary | ICD-10-CM | POA: Diagnosis not present

## 2016-07-01 DIAGNOSIS — F329 Major depressive disorder, single episode, unspecified: Secondary | ICD-10-CM | POA: Diagnosis not present

## 2016-07-01 DIAGNOSIS — M810 Age-related osteoporosis without current pathological fracture: Secondary | ICD-10-CM | POA: Diagnosis not present

## 2016-07-01 DIAGNOSIS — Z9851 Tubal ligation status: Secondary | ICD-10-CM | POA: Diagnosis not present

## 2016-07-01 DIAGNOSIS — Z883 Allergy status to other anti-infective agents status: Secondary | ICD-10-CM | POA: Diagnosis not present

## 2016-07-01 DIAGNOSIS — S52502D Unspecified fracture of the lower end of left radius, subsequent encounter for closed fracture with routine healing: Secondary | ICD-10-CM | POA: Diagnosis not present

## 2016-07-01 DIAGNOSIS — S62102A Fracture of unspecified carpal bone, left wrist, initial encounter for closed fracture: Secondary | ICD-10-CM | POA: Diagnosis not present

## 2016-07-04 ENCOUNTER — Telehealth: Payer: Self-pay | Admitting: Neurology

## 2016-07-04 ENCOUNTER — Ambulatory Visit
Admission: RE | Admit: 2016-07-04 | Discharge: 2016-07-04 | Disposition: A | Discharge status: Home / Self Care | Payer: Medicare Other | Source: Ambulatory Visit | Attending: Neurology | Admitting: Neurology

## 2016-07-04 DIAGNOSIS — G912 (Idiopathic) normal pressure hydrocephalus: Secondary | ICD-10-CM

## 2016-07-04 LAB — CSF CELL COUNT WITH DIFFERENTIAL
RBC Count, CSF: 230 cells/uL — ABNORMAL HIGH (ref 0–10)
WBC CSF: 1 {cells}/uL (ref 0–5)

## 2016-07-04 LAB — PROTEIN, CSF: Total Protein, CSF: 34 mg/dL (ref 15–60)

## 2016-07-04 LAB — GLUCOSE, CSF: Glucose, CSF: 77 mg/dL — ABNORMAL HIGH (ref 43–76)

## 2016-07-04 NOTE — Telephone Encounter (Signed)
Pt neighbor Debbie Hawkins(Debbie Hawkins) is with [name] at [name], Debbie Hawkins and son states Dr Lucia GaskinsAhern wants to see pt with 1-2 days of the procedure, no appointments avail. Pt scheduled for next Thurs 5-24 and on wait list.  Debbie Hawkins or Debbie Hawkins would like to be called to know if she can be seen anytime on Thurs of this week.

## 2016-07-04 NOTE — Discharge Instructions (Signed)

## 2016-07-05 DIAGNOSIS — E860 Dehydration: Secondary | ICD-10-CM | POA: Diagnosis not present

## 2016-07-05 DIAGNOSIS — G9341 Metabolic encephalopathy: Secondary | ICD-10-CM | POA: Diagnosis not present

## 2016-07-05 DIAGNOSIS — R2689 Other abnormalities of gait and mobility: Secondary | ICD-10-CM | POA: Diagnosis not present

## 2016-07-05 DIAGNOSIS — N390 Urinary tract infection, site not specified: Secondary | ICD-10-CM | POA: Diagnosis not present

## 2016-07-05 DIAGNOSIS — F0390 Unspecified dementia without behavioral disturbance: Secondary | ICD-10-CM | POA: Diagnosis not present

## 2016-07-05 NOTE — Telephone Encounter (Signed)
Returned call to pt's son and sooner appt scheduled this Thurs @ 10:30. Son agreed to call and notify neighbor Fannie Knee(Sue - not on DPR) as she will be driving pt to her appt.

## 2016-07-07 ENCOUNTER — Ambulatory Visit (INDEPENDENT_AMBULATORY_CARE_PROVIDER_SITE_OTHER): Payer: Medicare Other | Admitting: Neurology

## 2016-07-07 ENCOUNTER — Encounter: Payer: Self-pay | Admitting: Neurology

## 2016-07-07 VITALS — BP 111/75 | HR 104 | Ht 62.0 in | Wt 108.0 lb

## 2016-07-07 DIAGNOSIS — F0390 Unspecified dementia without behavioral disturbance: Secondary | ICD-10-CM | POA: Diagnosis not present

## 2016-07-07 MED ORDER — MEMANTINE HCL 5 MG PO TABS: 5.0000 mg | ORAL_TABLET | Freq: Two times a day (BID) | ORAL | 11 refills | Status: DC

## 2016-07-07 NOTE — Progress Notes (Addendum)
[facility]    Provider:  Dr Jaynee Eagles Referring Provider: Manon Hilding, MD Primary Care Physician:  Manon Hilding, MD  CC:  Dementia  Interval history 07/07/2016: Patient returns today after high volume LP to evaluate for normal pressure hydrocephalus. No improvement in gait or cognitive abilities. She fell last Wednesday and broke her wrist. She was climbing the steps and fell on the floor. This was 8am. Discussed a home safety inspection. They are trying to keep her on the main floor and they have help a few times a week.   HPI:  Debbie Hawkins is a 76 y.o. female here as a referral from Dr. Quintin Alto for dementia. MRI of the brain showed marketed ventriculomegaly. She is on Aricept. She is a current every day smoker. Past medical history of anxiety and depression.Patient is here for son and daughter. Patient says she can't remember things doesn't know when it started. She can't give anymore details. Here with a friend and her son. Her gait is impaired she has difficulty going up steps. She tells stories of things that did not happen. She gets people mixed up, she thought her primary doctor was a Environmental education officer. She often mistakes her son for her brother. She has periods of agitation, being mean. Symptoms started a year ago, progressive. She went to the hospital with a UTI 2 months ago and since then worse. She wsa very agitated in the hospital and needed sedation or a 24x7 sitter. They started Geodon in the hospital before she had the MRI. She can sign her name to a check but can't manage finances.  She lives in a home. Son is there all day long. She has difficulty using the microwave and seeing the numbers.  She has delusions, she thinks someone else is living with them. She obsesses over the dog. She has difficulty with the date, repeating the same stories or questions in 5 minutes. She thinks she has to perform tasks   Reviewed notes, labs and imaging from outside physicians, which  showed:  Reviewed MRI report and also images brought on CD by family and agree with the following. No acute stroke, acute hemorrhage, mass lesion, or extra-axial fluid. Marked ventriculomegaly, particularly affecting the lateral and third ventricles. His disproportionate lack of enlargement of the cortical sulci. Normal pressure hydrocephalus is not excluded. In addition to the findings which are consistent with chronic microvascular ischemic change, there is fairly continuous T2 FLAIR hyperintensity surrounding the ventricles which could represent transependymal absorption. Partially empty sella. Impression marked ventriculomegaly could represent normal pressure hydrocephalus. Chronic microvascular ischemic changes may be superimposed. There is a possibility of cortical atrophy. Consider lumbar puncture for further evaluation.  CMP 03/01/2016 with BUN 12 and creatinine 1.07, EGFR slightly reduced. CBC was unremarkable. TSH 3.2. RPR nonreactive. B12 and folate normal, B12 686.  Reviewed notes from [facility]. It appears as though she was admitted for Escherichia coli UTI,   Review of Systems: Patient complains of symptoms per HPI as well as the following symptoms: memory changes, confusion. Pertinent negatives per HPI. All others negative.   Social History   Social History  . Marital status: Divorced    Spouse name: N/A  . Number of children: 2  . Years of education: Nursing school   Occupational History  . Retired    Social History Main Topics  . Smoking status: Current Every Day Smoker  . Smokeless tobacco: Current User     Comment: Occasional  . Alcohol use  No  . Drug use: No  . Sexual activity: Not on file   Other Topics Concern  . Not on file   Social History Narrative   Lives w/ her son   Right-handed   Caffeine: 2 cups per day    Family History  Problem Relation Age of Onset  . Breast cancer Mother   . Stroke Father   . Dementia Neg Hx      Past Medical History:  Diagnosis Date  . Clavicle fracture   . Fall   . Memory loss     Past Surgical History:  Procedure Laterality Date  . TUBAL LIGATION    . WRIST FRACTURE SURGERY Left     Current Outpatient Prescriptions  Medication Sig Dispense Refill  . amitriptyline (ELAVIL) 25 MG tablet Take 25 mg by mouth at bedtime.  3  . donepezil (ARICEPT) 10 MG tablet Take 10 mg by mouth daily.  6  . traMADol (ULTRAM) 50 MG tablet TAKE ONE TABLET EVERY 6 HOURS AS NEEDED  0  . ziprasidone (GEODON) 20 MG capsule Take 20 mg by mouth 2 (two) times daily.  0  . memantine (NAMENDA) 5 MG tablet Take 1 tablet (5 mg total) by mouth 2 (two) times daily. 60 tablet 11   No current facility-administered medications for this visit.     Allergies as of 07/07/2016  . (No Known Allergies)    Vitals: BP 111/75   Pulse (!) 104   Ht 5' 2"  (1.575 m)   Wt 108 lb (49 kg)   BMI 19.75 kg/m  Last Weight:  Wt Readings from Last 1 Encounters:  07/07/16 108 lb (49 kg)   Last Height:   Ht Readings from Last 1 Encounters:  07/07/16 5' 2"  (1.575 m)    MMSE - Mini Mental State Exam 07/07/2016 06/21/2016  Orientation to time 0 3  Orientation to Place 2 2  Registration 3 3  Attention/ Calculation 5 4  Recall 0 0  Language- name 2 objects 1 2  Language- repeat 1 1  Language- follow 3 step command 0 2  Language- read & follow direction 0 0  Write a sentence 0 0  Copy design 1 0  Total score 13 17   The patient is oriented to person     recent and remote memory impaired;     language fluent;     Impaired attention, concentration, fund of knowledge Cranial Nerves:    The pupils are equal, round, and reactive to light. Attempted funduscopic exam could not visualize due to patient cooperation.. Visual fields are full to finger confrontation. Exotropia Extraocular movements are intact. Trigeminal sensation is intact and the muscles of mastication are normal. The face is symmetric. The palate  elevates in the midline. Hearing intact. Voice is normal. Shoulder shrug is normal. The tongue has normal motion without fasciculations.   Coordination:    Bradykinetic, no dymetria noted but limited exam because she had difficulty understanding the exam   Gait:    Magnetic gait with mild stooped posture  Motor Observation:    no involuntary movements noted. Tone:    Cogwheelig in the uppers  Posture:    Posture is mildly stooped    Strength: antigravity and equal, attempted could not perform full motor exam due to patient's inability to comprehend the instructions.      Sensation: intact to LT     Reflex Exam:  DTR's:    Deep tendon reflexes in the upper and lower  extremities are brisk bilaterally.   Toes:    The toes are downgoing bilaterally.   Clonus:    Clonus is absent.    Assessment/Plan:  76 year old with memory problems, dementia and marked ventriculomegaly suspicious for NPH. Her gait is magnetic. Mini-Mental status exam 17 out of 30.  Unfortunately there was no improvement after high volume lumbar puncture; no improvement in cognition or in gait. Family reports that she is actually worsened since a fall, MMSE 12/30.  - Continue Aricept. We'll start Namenda as well.  - I would minimize use of geodon and amitriptyline, if possible, in this 76 year old patient with memory disorder. Both of these medications can cause cognitive changes especially in the elderly. Will defer to PCP. However often times neuroleptics are used in the elderly but advised that can be significant side effects, they are not FDA approved for use in the elderly, and there is a black box warning for significant morbidity or mortality in the elderly. But often times benefits can outweigh the risks of atypical antipsychotics.   Cc: Dr. Quintin Alto  Mri head from [facility] (see scanned) 04/2016 showed marked ventriculomegaly, microvascular ischemia Sarina Ill, MD  [facility] [address] [place], [place] [address]  Phone [phone] Fax [phone]  A total of 15 minutes was spent face-to-face with this patient. Over half this time was spent on counseling patient on the dementia diagnosis and different diagnostic and therapeutic options available.

## 2016-07-07 NOTE — Patient Instructions (Signed)
As far as your medications are concerned, I would like to suggest  Namenda once daily for 2-4 weeks and then increase to twice daily  I would like to see you back in 6 months, sooner if we need to. Please call us with any interim questions, concerns, problems, updates or refill requests.   Our phone number is [phone]. We also have an after hours call service for urgent matters and there is a physician on-call for urgent questions. For any emergencies you know to call 911 or go to the nearest emergency room  Memantine Tablets What is this medicine? MEMANTINE (MEM an teen) is used to treat dementia caused by Alzheimer's disease. This medicine may be used for other purposes; ask your health care provider or pharmacist if you have questions. COMMON BRAND NAME(S): Namenda What should I tell my health care provider before I take this medicine? They need to know if you have any of these conditions: -difficulty passing urine -kidney disease -liver disease -seizures -an unusual or allergic reaction to memantine, other medicines, foods, dyes, or preservatives -pregnant or trying to get pregnant -breast-feeding How should I use this medicine? Take this medicine by mouth with a glass of water. Follow the directions on the prescription label. You may take this medicine with or without food. Take your doses at regular intervals. Do not take your medicine more often than directed. Continue to take your medicine even if you feel better. Do not stop taking except on the advice of your doctor or health care professional. Talk to your pediatrician regarding the use of this medicine in children. Special care may be needed. Overdosage: If you think you have taken too much of this medicine contact a poison control center or emergency room at once. NOTE: This medicine is only for you. Do not share this medicine with others. What if I miss a dose? If you miss a dose, take it as soon as you can. If it is  almost time for your next dose, take only that dose. Do not take double or extra doses. If you do not take your medicine for several days, contact your health care provider. Your dose may need to be changed. What may interact with this medicine? -acetazolamide -amantadine -cimetidine -dextromethorphan -dofetilide -hydrochlorothiazide -ketamine -metformin -methazolamide -quinidine -ranitidine -sodium bicarbonate -triamterene This list may not describe all possible interactions. Give your health care provider a list of all the medicines, herbs, non-prescription drugs, or dietary supplements you use. Also tell them if you smoke, drink alcohol, or use illegal drugs. Some items may interact with your medicine. What should I watch for while using this medicine? Visit your doctor or health care professional for regular checks on your progress. Check with your doctor or health care professional if there is no improvement in your symptoms or if they get worse. You may get drowsy or dizzy. Do not drive, use machinery, or do anything that needs mental alertness until you know how this drug affects you. Do not stand or sit up quickly, especially if you are an older patient. This reduces the risk of dizzy or fainting spells. Alcohol can make you more drowsy and dizzy. Avoid alcoholic drinks. What side effects may I notice from receiving this medicine? Side effects that you should report to your doctor or health care professional as soon as possible: -allergic reactions like skin rash, itching or hives, swelling of the face, lips, or tongue -agitation or a feeling of restlessness -depressed mood -dizziness -hallucinations -redness, blistering,  peeling or loosening of the skin, including inside the mouth -seizures -vomiting Side effects that usually do not require medical attention (report to your doctor or health care professional if they continue or are  bothersome): -constipation -diarrhea -headache -nausea -trouble sleeping This list may not describe all possible side effects. Call your doctor for medical advice about side effects. You may report side effects to FDA at 1-800-FDA-1088. Where should I keep my medicine? Keep out of the reach of children. Store at room temperature between 15 degrees and 30 degrees C (59 degrees and 86 degrees F). Throw away any unused medicine after the expiration date. NOTE: This sheet is a summary. It may not cover all possible information. If you have questions about this medicine, talk to your doctor, pharmacist, or health care provider.  2018 Elsevier/Gold Standard (2012-11-26 14:10:42)

## 2016-07-10 DIAGNOSIS — F0390 Unspecified dementia without behavioral disturbance: Secondary | ICD-10-CM | POA: Insufficient documentation

## 2016-07-11 DIAGNOSIS — Z681 Body mass index (BMI) 19 or less, adult: Secondary | ICD-10-CM | POA: Diagnosis not present

## 2016-07-11 DIAGNOSIS — Z720 Tobacco use: Secondary | ICD-10-CM | POA: Diagnosis not present

## 2016-07-11 DIAGNOSIS — G301 Alzheimer's disease with late onset: Secondary | ICD-10-CM | POA: Diagnosis not present

## 2016-07-11 DIAGNOSIS — F324 Major depressive disorder, single episode, in partial remission: Secondary | ICD-10-CM | POA: Diagnosis not present

## 2016-07-11 DIAGNOSIS — S62356A Nondisplaced fracture of shaft of fifth metacarpal bone, right hand, initial encounter for closed fracture: Secondary | ICD-10-CM | POA: Diagnosis not present

## 2016-07-11 DIAGNOSIS — N189 Chronic kidney disease, unspecified: Secondary | ICD-10-CM | POA: Diagnosis not present

## 2016-07-11 DIAGNOSIS — F411 Generalized anxiety disorder: Secondary | ICD-10-CM | POA: Diagnosis not present

## 2016-07-11 DIAGNOSIS — S42022A Displaced fracture of shaft of left clavicle, initial encounter for closed fracture: Secondary | ICD-10-CM | POA: Diagnosis not present

## 2016-07-11 DIAGNOSIS — Z9889 Other specified postprocedural states: Secondary | ICD-10-CM | POA: Diagnosis not present

## 2016-07-11 DIAGNOSIS — S52502A Unspecified fracture of the lower end of left radius, initial encounter for closed fracture: Secondary | ICD-10-CM | POA: Diagnosis not present

## 2016-07-14 ENCOUNTER — Ambulatory Visit: Payer: Medicare Other | Admitting: Neurology

## 2016-07-15 ENCOUNTER — Inpatient Hospital Stay
Admission: RE | Admit: 2016-07-15 | Discharge: 2016-07-20 | Disposition: A | Discharge status: Home / Self Care | Payer: Medicare Other | Source: Ambulatory Visit | Attending: Internal Medicine | Admitting: Internal Medicine

## 2016-07-19 ENCOUNTER — Encounter (HOSPITAL_COMMUNITY)
Admission: RE | Admit: 2016-07-19 | Discharge: 2016-07-19 | Disposition: A | Discharge status: Home / Self Care | Payer: Medicare Other | Source: Skilled Nursing Facility | Attending: Pediatrics | Admitting: Pediatrics

## 2016-07-19 ENCOUNTER — Non-Acute Institutional Stay (SKILLED_NURSING_FACILITY): Payer: Medicare Other | Admitting: Internal Medicine

## 2016-07-19 ENCOUNTER — Encounter: Payer: Self-pay | Admitting: Internal Medicine

## 2016-07-19 DIAGNOSIS — R269 Unspecified abnormalities of gait and mobility: Secondary | ICD-10-CM

## 2016-07-19 DIAGNOSIS — R296 Repeated falls: Secondary | ICD-10-CM

## 2016-07-19 DIAGNOSIS — Z9181 History of falling: Secondary | ICD-10-CM | POA: Diagnosis not present

## 2016-07-19 DIAGNOSIS — F0390 Unspecified dementia without behavioral disturbance: Secondary | ICD-10-CM | POA: Diagnosis not present

## 2016-07-19 LAB — CBC WITH DIFFERENTIAL/PLATELET
BASOS ABS: 0 10*3/uL (ref 0.0–0.1)
Basophils Relative: 0 %
EOS ABS: 0 10*3/uL (ref 0.0–0.7)
Eosinophils Relative: 0 %
HCT: 40.1 % (ref 36.0–46.0)
HEMOGLOBIN: 13.3 g/dL (ref 12.0–15.0)
Lymphocytes Relative: 17 %
Lymphs Abs: 2.3 10*3/uL (ref 0.7–4.0)
MCH: 29.9 pg (ref 26.0–34.0)
MCHC: 33.2 g/dL (ref 30.0–36.0)
MCV: 90.1 fL (ref 78.0–100.0)
Monocytes Absolute: 1 10*3/uL (ref 0.1–1.0)
Monocytes Relative: 8 %
NEUTROS PCT: 75 %
Neutro Abs: 9.8 10*3/uL — ABNORMAL HIGH (ref 1.7–7.7)
PLATELETS: 380 10*3/uL (ref 150–400)
RBC: 4.45 MIL/uL (ref 3.87–5.11)
RDW: 13.7 % (ref 11.5–15.5)
WBC: 13.1 10*3/uL — AB (ref 4.0–10.5)

## 2016-07-19 LAB — COMPREHENSIVE METABOLIC PANEL
ALBUMIN: 3.8 g/dL (ref 3.5–5.0)
ALT: 27 U/L (ref 14–54)
AST: 30 U/L (ref 15–41)
Alkaline Phosphatase: 216 U/L — ABNORMAL HIGH (ref 38–126)
Anion gap: 11 (ref 5–15)
BUN: 24 mg/dL — AB (ref 6–20)
CHLORIDE: 102 mmol/L (ref 101–111)
CO2: 23 mmol/L (ref 22–32)
CREATININE: 1 mg/dL (ref 0.44–1.00)
Calcium: 9.3 mg/dL (ref 8.9–10.3)
GFR calc Af Amer: >60 mL/min (ref >60)
GFR calc non Af Amer: 53 mL/min — ABNORMAL LOW (ref >60)
GLUCOSE: 140 mg/dL — AB (ref 65–99)
Potassium: 3.6 mmol/L (ref 3.5–5.1)
Sodium: 136 mmol/L (ref 135–145)
Total Bilirubin: 1.6 mg/dL — ABNORMAL HIGH (ref 0.3–1.2)
Total Protein: 7.7 g/dL (ref 6.5–8.1)

## 2016-07-19 NOTE — Progress Notes (Signed)
Provider:Aanshi Batchelder,Breck Hollinger   Location:   [facility] Room Number: 142/W Place of Service:  SNF (31)  PCP: Sasser, Clarene Critchley, MD Patient Care Team: Estanislado Pandy, MD as PCP - General (Family Medicine)  Extended Emergency Contact Information Primary Emergency Contact: Caprice [place]aver [place] Home Phone: [phone] Mobile Phone: [phone] Relation: Friend Secondary Emergency Contact: Jentz,Charles  [place] Mobile Phone: [phone] Relation: Son  Code Status: DNR Goals of Care: Advanced Directive information Advanced Directives 07/19/2016  Does Patient Have a Medical Advance Directive? Yes  Type of Advance Directive Out of facility DNR (pink MOST or yellow form)  Does patient want to make changes to medical advance directive? No - Patient declined      Chief Complaint  Patient presents with  . New Admit To SNF    HPI: Patient is a 76 y.o. female seen today for admission to SNF for therapy and possible Long term Care. Patient came from home as she has been falling many times at home. Most of the history was obtained from reviewing Notes from Neurology. She was having memory and Gait issues at home and was admitted once to hospital with UTI and had Acute delirium requiring 24 hour Sitter. She was started on Geodon in the hospital and had MRI of Head. 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 She did not have any change in her Mental status which  actually got worse after the tap. Her MMSE was 12/30 in Neurology office. After the Tap.  Patient was at home with her son and continuous to have recurrent falls and now is admitted to SNF for therapy and Care According to the Nurses she stays very confused and continous to be very high risk for fall. They are not leaving her in the room as she tries to get up and falls. She denied any complains to me but was very confused and just kept  trying to get up from her Wheel chair.  Past Medical History:  Diagnosis Date  . Clavicle fracture   . Fall   . Memory loss    Past Surgical History:  Procedure Laterality Date  . TUBAL LIGATION    . WRIST FRACTURE SURGERY Left     reports that she has been smoking.  She uses smokeless tobacco. She reports that she does not drink alcohol or use drugs. Social History   Social History  . Marital status: Divorced    Spouse name: N/A  . Number of children: 2  . Years of education: Nursing school   Occupational History  . Retired    Social History Main Topics  . Smoking status: Current Every Day Smoker  . Smokeless tobacco: Current User     Comment: Occasional  . Alcohol use No  . Drug use: No  . Sexual activity: Not on file   Other Topics Concern  . Not on file   Social History Narrative   Lives w/ her son   Right-handed   Caffeine: 2 cups per day    Functional Status Survey:    Family History  Problem Relation Age of Onset  . Breast cancer Mother   . Stroke Father   . Dementia Neg Hx     Health Maintenance  Topic Date Due  . TETANUS/TDAP  11/15/1958  . DEXA SCAN  11/14/2004  . PNA vac Low Risk Adult (1 of 2 - PCV13) 11/14/2004  . INFLUENZA VACCINE  09/21/2016    No Known Allergies  Outpatient Encounter Prescriptions as of 07/19/2016  Medication Sig  . amitriptyline (ELAVIL) 25 MG tablet Take 25 mg by mouth at bedtime.  . donepezil (ARICEPT) 10 MG tablet Take 10 mg by mouth daily.  . polyethylene glycol (MIRALAX / GLYCOLAX) packet Take 17 g by mouth daily.  . [DISCONTINUED] memantine (NAMENDA) 5 MG tablet Take 1 tablet (5 mg total) by mouth 2 (two) times daily.  . [DISCONTINUED] traMADol (ULTRAM) 50 MG tablet TAKE ONE TABLET EVERY 6 HOURS AS NEEDED  . [DISCONTINUED] ziprasidone (GEODON) 20 MG capsule Take 20 mg by mouth 2 (two) times daily.   No facility-administered encounter medications on file as of 07/19/2016.      Review of Systems  Unable to  perform ROS: Dementia    Vitals:   07/19/16 0857  BP: 129/74  Pulse: 89  Resp: (!) 22  Temp: 97.1 F (36.2 C)  TempSrc: Oral   There is no height or weight on file to calculate BMI. Physical Exam  Constitutional:  Patient is Pettite well developed Female  HENT:  Head: Normocephalic.  Mouth/Throat: Oropharynx is clear and moist.  Eyes: Pupils are equal, round, and reactive to light.  Neck: Neck supple.  Cardiovascular: Normal rate, regular rhythm and normal heart sounds.   No murmur heard. Pulmonary/Chest: Effort normal and breath sounds normal. No respiratory distress. She has no wheezes. She has no rales.  Abdominal: Soft. Bowel sounds are normal. She exhibits no distension. There is no tenderness. There is no rebound.  Musculoskeletal:  Trace edema  Neurological: She is alert.  Not oriented. Gets distracted when ask questions. Does know her DOB. Speech somewhat Slurred. Follows simple commands. Cannot name objects or there use. No Focal deficit. Could not examine strength as she would not follow commands. Tries to get up to go to [employer]athroom.  Skin: Skin is warm and dry.  Psychiatric: She has a normal mood and affect. Thought content normal. Her speech is slurred. Cognition and memory are impaired.  She says she gets depressed but not enough to bother her. She is inattentive.    Labs reviewed: Basic Metabolic Panel: No results for input(s): NA, K, CL, CO2, GLUCOSE, BUN, CREATININE, CALCIUM, MG, PHOS in the last 8760 hours. Liver Function Tests: No results for input(s): AST, ALT, ALKPHOS, BILITOT, PROT, ALBUMIN in the last 8760 hours. No results for input(s): LIPASE, AMYLASE in the last 8760 hours. No results for input(s): AMMONIA in the last 8760 hours. CBC: No results for input(s): WBC, NEUTROABS, HGB, HCT, MCV, PLT in the last 8760 hours. Cardiac Enzymes: No results for input(s): CKTOTAL, CKMB, CKMBINDEX, TROPONINI in the last 8760 hours. BNP: Invalid input(s):  POCBNP No results found for: HGBA1C No results found for: TSH No results found for: VITAMINB12 No results found for: FOLATE No results found for: IRON, TIBC, FERRITIN  Imaging and Procedures obtained prior to SNF admission: No results found.  Assessment/Plan  Dementia with Gait Abnormality and recurrent Falls. Patient is on Aricept now. Was on Namenda before which was discontinued. Will continue Nursing support. Continues to be very High risk for falls.  Increased alkaline phosphatase Will get detail Hepatic panel. She also has mild Leucocytosis Will Repeat Labs tomorrow and follow. Family/ staff Communication:   Labs/tests ordered:

## 2016-07-20 ENCOUNTER — Encounter (HOSPITAL_COMMUNITY)
Admission: RE | Admit: 2016-07-20 | Discharge: 2016-07-20 | Disposition: A | Discharge status: Home / Self Care | Payer: Medicare Other | Source: Skilled Nursing Facility | Attending: *Deleted | Admitting: *Deleted

## 2016-07-20 ENCOUNTER — Encounter: Payer: Self-pay | Admitting: Internal Medicine

## 2016-07-20 ENCOUNTER — Non-Acute Institutional Stay (SKILLED_NURSING_FACILITY): Payer: Medicare Other | Admitting: Internal Medicine

## 2016-07-20 DIAGNOSIS — Z9181 History of falling: Secondary | ICD-10-CM | POA: Diagnosis not present

## 2016-07-20 DIAGNOSIS — R296 Repeated falls: Secondary | ICD-10-CM

## 2016-07-20 DIAGNOSIS — F0390 Unspecified dementia without behavioral disturbance: Secondary | ICD-10-CM | POA: Diagnosis not present

## 2016-07-20 DIAGNOSIS — D72829 Elevated white blood cell count, unspecified: Secondary | ICD-10-CM | POA: Diagnosis not present

## 2016-07-20 DIAGNOSIS — S62101D Fracture of unspecified carpal bone, right wrist, subsequent encounter for fracture with routine healing: Secondary | ICD-10-CM | POA: Diagnosis not present

## 2016-07-20 LAB — CBC WITH DIFFERENTIAL/PLATELET
Basophils Absolute: 0 10*3/uL (ref 0.0–0.1)
Basophils Relative: 0 %
EOS PCT: 0 %
Eosinophils Absolute: 0 10*3/uL (ref 0.0–0.7)
HEMATOCRIT: 40.5 % (ref 36.0–46.0)
HEMOGLOBIN: 13.4 g/dL (ref 12.0–15.0)
LYMPHS ABS: 1.2 10*3/uL (ref 0.7–4.0)
LYMPHS PCT: 11 %
MCH: 30 pg (ref 26.0–34.0)
MCHC: 33.1 g/dL (ref 30.0–36.0)
MCV: 90.6 fL (ref 78.0–100.0)
Monocytes Absolute: 0.9 10*3/uL (ref 0.1–1.0)
Monocytes Relative: 8 %
NEUTROS ABS: 9.2 10*3/uL — AB (ref 1.7–7.7)
Neutrophils Relative %: 81 %
PLATELETS: 357 10*3/uL (ref 150–400)
RBC: 4.47 MIL/uL (ref 3.87–5.11)
RDW: 13.8 % (ref 11.5–15.5)
WBC: 11.3 10*3/uL — AB (ref 4.0–10.5)

## 2016-07-20 LAB — BASIC METABOLIC PANEL
Anion gap: 11 (ref 5–15)
BUN: 28 mg/dL — AB (ref 6–20)
CHLORIDE: 103 mmol/L (ref 101–111)
CO2: 22 mmol/L (ref 22–32)
Calcium: 9.3 mg/dL (ref 8.9–10.3)
Creatinine, Ser: 0.99 mg/dL (ref 0.44–1.00)
GFR calc Af Amer: >60 mL/min (ref >60)
GFR calc non Af Amer: 54 mL/min — ABNORMAL LOW (ref >60)
Glucose, Bld: 122 mg/dL — ABNORMAL HIGH (ref 65–99)
POTASSIUM: 4.3 mmol/L (ref 3.5–5.1)
SODIUM: 136 mmol/L (ref 135–145)

## 2016-07-20 LAB — HEPATIC FUNCTION PANEL
ALK PHOS: 216 U/L — AB (ref 38–126)
ALT: 26 U/L (ref 14–54)
AST: 29 U/L (ref 15–41)
Albumin: 3.7 g/dL (ref 3.5–5.0)
BILIRUBIN DIRECT: 0.2 mg/dL (ref 0.1–0.5)
BILIRUBIN INDIRECT: 0.8 mg/dL (ref 0.3–0.9)
BILIRUBIN TOTAL: 1 mg/dL (ref 0.3–1.2)
Total Protein: 7.7 g/dL (ref 6.5–8.1)

## 2016-07-20 NOTE — Progress Notes (Signed)
Location:   [facility] Room Number: 142/W Place of Service:  SNF (31)  Provider: Zacharey Jensen,Hagop Mccollam  PCP: Estanislado Pandy, MD Patient Care Team: Estanislado Pandy, MD as PCP - General (Family Medicine)  Extended Emergency Contact Information Primary Emergency Contact: Caprice Beaver [place] Home Phone: [phone] Mobile Phone: [phone] Relation: Friend Secondary Emergency Contact: Hearld,Charles  [place] Mobile Phone: [phone] Relation: Son  Code Status: DNR Goals of care:  Advanced Directive information Advanced Directives 07/20/2016  Does Patient Have a Medical Advance Directive? Yes  Type of Advance Directive Out of facility DNR (pink MOST or yellow form)  Does patient want to make changes to medical advance directive? No - Patient declined     No Known Allergies  Chief Complaint  Patient presents with  . Discharge Note    HPI:  76 y.o. female seen today for discharge.  She actually was just admitted here over the weekend-she came from home and she had been falling.  She's also been seen by neurology with a history gait issues and memory issues.  One point she was admitted to the hospital of the UTI had acute delirium that required a 24-hour care sitter.   She was started on Geodon in the hospital and had MRI of Head. 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 She did not have any change in her Mental status which  actually got worse after the tap. Her MMSE was 12/30 in Neurology office. After the Tap.  She is home with her son but continued to have weak and recurrent falls and was admitted here for therapy and care.  She does have a history of a right wrist fracture after a fall earlier this month.    However apparently her family has decided they would prefer that she get her therapy at home and apparently will be providing 24-hour care again.  She  continues to be confused but is pleasant and cooperative with exam today.  Lab work yesterday showed a white count slightly above 13,000  Alkaline phosphatase was 216 total bilirubin was mildly elevated at 1.6.  We did do updated lab work today which is reassuring cytosis appears to be resolving with a white count of 11,300 today-alkaline phosphatase is still mildly elevated at 216 bilirubin has normalized at 1.0.  She is not complaining of any fever or chills nursing staff has not noted increased cough congestion or complaints of dysuria        Past Medical History:  Diagnosis Date  . Clavicle fracture   . Fall   . Memory loss     Past Surgical History:  Procedure Laterality Date  . TUBAL LIGATION    . WRIST FRACTURE SURGERY Left       reports that she has been smoking.  She uses smokeless tobacco. She reports that she does not drink alcohol or use drugs. Social History   Social History  . Marital status: Divorced    Spouse name: N/A  . Number of children: 2  . Years of education: Nursing school   Occupational History  . Retired    Social History Main Topics  . Smoking status: Current Every Day Smoker  . Smokeless tobacco: Current User     Comment: Occasional  . Alcohol use No  . Drug use: No  . Sexual activity: Not on file   Other Topics Concern  . Not on file   Social  History Narrative   Lives w/ her son   Right-handed   Caffeine: 2 cups per day   Functional Status Survey:    No Known Allergies  Pertinent  Health Maintenance Due  Topic Date Due  . DEXA SCAN  11/14/2004  . PNA vac Low Risk Adult (1 of 2 - PCV13) 11/14/2004  . INFLUENZA VACCINE  09/21/2016    Medications: Outpatient Encounter Prescriptions as of 07/20/2016  Medication Sig  . amitriptyline (ELAVIL) 25 MG tablet Take 25 mg by mouth at bedtime.  . donepezil (ARICEPT) 10 MG tablet Take 10 mg by mouth daily.  . polyethylene glycol (MIRALAX / GLYCOLAX) packet Take 17 g by mouth  daily.   No facility-administered encounter medications on file as of 07/20/2016.      Review of Systems   Essentially unattainable secondary to dementia  Vitals:   07/20/16 1154  BP: 116/75  Pulse: 87  Resp: 18  Temp: 97.4 F (36.3 C)  TempSrc: Oral  Weight: 108 lb 9.6 oz (49.3 kg)  Height: 5\' 4"  (1.626 m)   Body mass index is 18.64 kg/m. Physical Exam   In general this is a somewhat frail elderly female in no distress sitting comfortably in her wheelchair she is alert but confused.  Her skin is warm and dry.  Eyes pupils appear reactive light sclerae and conjunctivae clear visual acuity appears grossly intact.  Oropharynx clear mucous membranes moist.  Neck could not really appreciate any adenopathy.  Heart is regular rate and rhythm without murmur gallop or rub she has mild lower extremity edema bilaterally.  Abdomen is soft nontender with positive bowel sounds.  Musculoskeletal does move all extremities 4 is ambulatory in wheelchair she does have some slight edema of her right arm and wrist area-radial pulse is intact grip strength appears to be relatively intact.  Neurologic is grossly intact her speech is clear but she is confused.  Psych she is oriented to self and follows simple commands without difficulty but has a limited attention span she did not try to get out of the wheelchair during the exam.       Labs reviewed:  \07/20/2016.  WBC 11.3 this is down from 13,100 yesterday-hemoglobin 13.4 platelets 357 neutrophils absolute mildly elevated at 9.2 was 9.8 yesterday.  Sodium was 136 potassium 4.3 BUN 28 creatinine 0.99.  Liver function tests unremarkable except for alkaline phosphatase of 216 which is comparable to level yesterday       Basic Metabolic Panel:  Recent Labs  16/10/96 0400  NA 136  K 3.6  CL 102  CO2 23  GLUCOSE 140*  BUN 24*  CREATININE 1.00  CALCIUM 9.3   Liver Function Tests:  Recent Labs  07/19/16 0400  AST  30  ALT 27  ALKPHOS 216*  BILITOT 1.6*  PROT 7.7  ALBUMIN 3.8   No results for input(s): LIPASE, AMYLASE in the last 8760 hours. No results for input(s): AMMONIA in the last 8760 hours. CBC:  Recent Labs  07/19/16 0400  WBC 13.1*  NEUTROABS 9.8*  HGB 13.3  HCT 40.1  MCV 90.1  PLT 380   Cardiac Enzymes: No results for input(s): CKTOTAL, CKMB, CKMBINDEX, TROPONINI in the last 8760 hours. BNP: Invalid input(s): POCBNP CBG: No results for input(s): GLUCAP in the last 8760 hours.  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 EXAM: DIAGNOSTIC LUMBAR  PUNCTURE UNDER FLUOROSCOPIC GUIDANCE FLUOROSCOPY TIME:  Radiation Exposure Index (as provided by the fluoroscopic device): 12.54 microGray*m^2 Fluoroscopy Time (in minutes and seconds):  1 second PROCEDURE: Informed consent was obtained from the patient prior to the procedure, including potential complications of headache, allergy, and pain. With the patient in the right lateral decubitus position, the lower back was prepped with Betadine. 1% Lidocaine was used for local anesthesia. Lumbar puncture was performed at the L3-4 level using a 3.5 inch 20 gauge needle via a right interlaminar approach with return of clear CSF with an opening pressure of 11 cm water. 32 mL of CSF were obtained for laboratory studies. The patient tolerated the procedure well and there were no apparent complications. IMPRESSION: Successful fluoroscopic guided high volume lumbar puncture. Electronically Signed   By: Sebastian AcheAllen  Grady M.D.   On: 07/04/2016 14:45    Assessment/Plan:      Dementia with gait abnormality and recurrent falls.  She continues on Aricept had been apparently on Namenda before but was discontinued. She also continues on Elavil  She will be going back home and will be receiving therapy-as well as  apparently 24-hour care from family which is certainly warranted-family again really wants her to come back home.  #2 history of increased alkaline phosphatase-attic panel shows stability here today compared to yesterday-will notify primary care provider of these results so we can follow-up when patient is seen by them in approximately a week-she is not really complaining of any abdominal discomfort nausea or vomiting.  She does again have a history of a recent right wrist fracture which may be contributing to the mild elevation but suspect she will need follow-up here when seen by primary care provider.  #3 history of right wrist fracture again will need follow-up by primary care and orthopedics as warranted she appears to be stable in this regards.  #4 history of mild leukocytosis this appears to be normalizing is now minimally elevated this will need follow-up as well by primary care provider she does not really show signs of infection i.e. dysuria diarrhea chest congestion or cough she doesnot have an elevated temperature but again this will have to be monitored.  Again she will be receiving PT and OT at home she does have strong family support continues to be a fall risk but family really wants to her  have at home.  She will need expedient follow up by primary care provider has been arranged-- Also will have home health draw a CBC with differential and CMP on Friday, June 1 and notify PCP of results  [id number]-of note greater than 30 minutes spent on this discharge summary-greater than 50% of time spent coordinating plan of care for numerous diagnoses

## 2016-07-21 ENCOUNTER — Encounter (HOSPITAL_COMMUNITY)
Admission: RE | Admit: 2016-07-21 | Discharge: 2016-07-21 | Disposition: A | Discharge status: Home / Self Care | Payer: Medicare Other | Source: Skilled Nursing Facility | Attending: Internal Medicine | Admitting: Internal Medicine

## 2016-07-21 DIAGNOSIS — F0391 Unspecified dementia with behavioral disturbance: Secondary | ICD-10-CM | POA: Insufficient documentation

## 2016-07-21 DIAGNOSIS — S42002D Fracture of unspecified part of left clavicle, subsequent encounter for fracture with routine healing: Secondary | ICD-10-CM | POA: Diagnosis not present

## 2016-07-21 DIAGNOSIS — Z9181 History of falling: Secondary | ICD-10-CM | POA: Insufficient documentation

## 2016-07-21 DIAGNOSIS — F17219 Nicotine dependence, cigarettes, with unspecified nicotine-induced disorders: Secondary | ICD-10-CM | POA: Diagnosis not present

## 2016-07-21 DIAGNOSIS — W19XXXD Unspecified fall, subsequent encounter: Secondary | ICD-10-CM | POA: Diagnosis not present

## 2016-07-21 DIAGNOSIS — S62102D Fracture of unspecified carpal bone, left wrist, subsequent encounter for fracture with routine healing: Secondary | ICD-10-CM | POA: Diagnosis not present

## 2016-07-21 DIAGNOSIS — G309 Alzheimer's disease, unspecified: Secondary | ICD-10-CM | POA: Diagnosis not present

## 2016-07-21 DIAGNOSIS — F0280 Dementia in other diseases classified elsewhere without behavioral disturbance: Secondary | ICD-10-CM | POA: Diagnosis not present

## 2016-07-21 DIAGNOSIS — G910 Communicating hydrocephalus: Secondary | ICD-10-CM | POA: Insufficient documentation

## 2016-07-22 DIAGNOSIS — F17219 Nicotine dependence, cigarettes, with unspecified nicotine-induced disorders: Secondary | ICD-10-CM | POA: Diagnosis not present

## 2016-07-22 DIAGNOSIS — D509 Iron deficiency anemia, unspecified: Secondary | ICD-10-CM | POA: Diagnosis not present

## 2016-07-22 DIAGNOSIS — G309 Alzheimer's disease, unspecified: Secondary | ICD-10-CM | POA: Diagnosis not present

## 2016-07-22 DIAGNOSIS — S62102D Fracture of unspecified carpal bone, left wrist, subsequent encounter for fracture with routine healing: Secondary | ICD-10-CM | POA: Diagnosis not present

## 2016-07-22 DIAGNOSIS — W19XXXD Unspecified fall, subsequent encounter: Secondary | ICD-10-CM | POA: Diagnosis not present

## 2016-07-22 DIAGNOSIS — N189 Chronic kidney disease, unspecified: Secondary | ICD-10-CM | POA: Diagnosis not present

## 2016-07-22 DIAGNOSIS — B999 Unspecified infectious disease: Secondary | ICD-10-CM | POA: Diagnosis not present

## 2016-07-22 DIAGNOSIS — F0280 Dementia in other diseases classified elsewhere without behavioral disturbance: Secondary | ICD-10-CM | POA: Diagnosis not present

## 2016-07-22 DIAGNOSIS — S42002D Fracture of unspecified part of left clavicle, subsequent encounter for fracture with routine healing: Secondary | ICD-10-CM | POA: Diagnosis not present

## 2016-07-25 DIAGNOSIS — S42002D Fracture of unspecified part of left clavicle, subsequent encounter for fracture with routine healing: Secondary | ICD-10-CM | POA: Diagnosis not present

## 2016-07-25 DIAGNOSIS — G309 Alzheimer's disease, unspecified: Secondary | ICD-10-CM | POA: Diagnosis not present

## 2016-07-25 DIAGNOSIS — F17219 Nicotine dependence, cigarettes, with unspecified nicotine-induced disorders: Secondary | ICD-10-CM | POA: Diagnosis not present

## 2016-07-25 DIAGNOSIS — S62102D Fracture of unspecified carpal bone, left wrist, subsequent encounter for fracture with routine healing: Secondary | ICD-10-CM | POA: Diagnosis not present

## 2016-07-25 DIAGNOSIS — W19XXXD Unspecified fall, subsequent encounter: Secondary | ICD-10-CM | POA: Diagnosis not present

## 2016-07-25 DIAGNOSIS — F0280 Dementia in other diseases classified elsewhere without behavioral disturbance: Secondary | ICD-10-CM | POA: Diagnosis not present

## 2016-07-26 DIAGNOSIS — F17219 Nicotine dependence, cigarettes, with unspecified nicotine-induced disorders: Secondary | ICD-10-CM | POA: Diagnosis not present

## 2016-07-26 DIAGNOSIS — G309 Alzheimer's disease, unspecified: Secondary | ICD-10-CM | POA: Diagnosis not present

## 2016-07-26 DIAGNOSIS — W19XXXD Unspecified fall, subsequent encounter: Secondary | ICD-10-CM | POA: Diagnosis not present

## 2016-07-26 DIAGNOSIS — S42002D Fracture of unspecified part of left clavicle, subsequent encounter for fracture with routine healing: Secondary | ICD-10-CM | POA: Diagnosis not present

## 2016-07-26 DIAGNOSIS — S62102D Fracture of unspecified carpal bone, left wrist, subsequent encounter for fracture with routine healing: Secondary | ICD-10-CM | POA: Diagnosis not present

## 2016-07-26 DIAGNOSIS — F0280 Dementia in other diseases classified elsewhere without behavioral disturbance: Secondary | ICD-10-CM | POA: Diagnosis not present

## 2016-07-27 DIAGNOSIS — F411 Generalized anxiety disorder: Secondary | ICD-10-CM | POA: Diagnosis not present

## 2016-07-27 DIAGNOSIS — F0280 Dementia in other diseases classified elsewhere without behavioral disturbance: Secondary | ICD-10-CM | POA: Diagnosis not present

## 2016-07-27 DIAGNOSIS — S62102D Fracture of unspecified carpal bone, left wrist, subsequent encounter for fracture with routine healing: Secondary | ICD-10-CM | POA: Diagnosis not present

## 2016-07-27 DIAGNOSIS — G912 (Idiopathic) normal pressure hydrocephalus: Secondary | ICD-10-CM | POA: Diagnosis not present

## 2016-07-27 DIAGNOSIS — Z681 Body mass index (BMI) 19 or less, adult: Secondary | ICD-10-CM | POA: Diagnosis not present

## 2016-07-27 DIAGNOSIS — W19XXXD Unspecified fall, subsequent encounter: Secondary | ICD-10-CM | POA: Diagnosis not present

## 2016-07-27 DIAGNOSIS — K5901 Slow transit constipation: Secondary | ICD-10-CM | POA: Diagnosis not present

## 2016-07-27 DIAGNOSIS — F5101 Primary insomnia: Secondary | ICD-10-CM | POA: Diagnosis not present

## 2016-07-27 DIAGNOSIS — S42002D Fracture of unspecified part of left clavicle, subsequent encounter for fracture with routine healing: Secondary | ICD-10-CM | POA: Diagnosis not present

## 2016-07-27 DIAGNOSIS — F17219 Nicotine dependence, cigarettes, with unspecified nicotine-induced disorders: Secondary | ICD-10-CM | POA: Diagnosis not present

## 2016-07-27 DIAGNOSIS — G309 Alzheimer's disease, unspecified: Secondary | ICD-10-CM | POA: Diagnosis not present

## 2016-07-27 DIAGNOSIS — F0281 Dementia in other diseases classified elsewhere with behavioral disturbance: Secondary | ICD-10-CM | POA: Diagnosis not present

## 2016-07-28 DIAGNOSIS — F0280 Dementia in other diseases classified elsewhere without behavioral disturbance: Secondary | ICD-10-CM | POA: Diagnosis not present

## 2016-07-28 DIAGNOSIS — F17219 Nicotine dependence, cigarettes, with unspecified nicotine-induced disorders: Secondary | ICD-10-CM | POA: Diagnosis not present

## 2016-07-28 DIAGNOSIS — G309 Alzheimer's disease, unspecified: Secondary | ICD-10-CM | POA: Diagnosis not present

## 2016-07-28 DIAGNOSIS — W19XXXD Unspecified fall, subsequent encounter: Secondary | ICD-10-CM | POA: Diagnosis not present

## 2016-07-28 DIAGNOSIS — S62102D Fracture of unspecified carpal bone, left wrist, subsequent encounter for fracture with routine healing: Secondary | ICD-10-CM | POA: Diagnosis not present

## 2016-07-28 DIAGNOSIS — S42002D Fracture of unspecified part of left clavicle, subsequent encounter for fracture with routine healing: Secondary | ICD-10-CM | POA: Diagnosis not present

## 2016-07-29 DIAGNOSIS — S62102D Fracture of unspecified carpal bone, left wrist, subsequent encounter for fracture with routine healing: Secondary | ICD-10-CM | POA: Diagnosis not present

## 2016-07-29 DIAGNOSIS — F0280 Dementia in other diseases classified elsewhere without behavioral disturbance: Secondary | ICD-10-CM | POA: Diagnosis not present

## 2016-07-29 DIAGNOSIS — W19XXXD Unspecified fall, subsequent encounter: Secondary | ICD-10-CM | POA: Diagnosis not present

## 2016-07-29 DIAGNOSIS — G309 Alzheimer's disease, unspecified: Secondary | ICD-10-CM | POA: Diagnosis not present

## 2016-07-29 DIAGNOSIS — F17219 Nicotine dependence, cigarettes, with unspecified nicotine-induced disorders: Secondary | ICD-10-CM | POA: Diagnosis not present

## 2016-07-29 DIAGNOSIS — S42002D Fracture of unspecified part of left clavicle, subsequent encounter for fracture with routine healing: Secondary | ICD-10-CM | POA: Diagnosis not present

## 2016-08-01 DIAGNOSIS — F0280 Dementia in other diseases classified elsewhere without behavioral disturbance: Secondary | ICD-10-CM | POA: Diagnosis not present

## 2016-08-01 DIAGNOSIS — F17219 Nicotine dependence, cigarettes, with unspecified nicotine-induced disorders: Secondary | ICD-10-CM | POA: Diagnosis not present

## 2016-08-01 DIAGNOSIS — G309 Alzheimer's disease, unspecified: Secondary | ICD-10-CM | POA: Diagnosis not present

## 2016-08-01 DIAGNOSIS — W19XXXD Unspecified fall, subsequent encounter: Secondary | ICD-10-CM | POA: Diagnosis not present

## 2016-08-01 DIAGNOSIS — S62102D Fracture of unspecified carpal bone, left wrist, subsequent encounter for fracture with routine healing: Secondary | ICD-10-CM | POA: Diagnosis not present

## 2016-08-01 DIAGNOSIS — S42002D Fracture of unspecified part of left clavicle, subsequent encounter for fracture with routine healing: Secondary | ICD-10-CM | POA: Diagnosis not present

## 2016-08-02 DIAGNOSIS — W19XXXD Unspecified fall, subsequent encounter: Secondary | ICD-10-CM | POA: Diagnosis not present

## 2016-08-02 DIAGNOSIS — F0280 Dementia in other diseases classified elsewhere without behavioral disturbance: Secondary | ICD-10-CM | POA: Diagnosis not present

## 2016-08-02 DIAGNOSIS — S42002D Fracture of unspecified part of left clavicle, subsequent encounter for fracture with routine healing: Secondary | ICD-10-CM | POA: Diagnosis not present

## 2016-08-02 DIAGNOSIS — F17219 Nicotine dependence, cigarettes, with unspecified nicotine-induced disorders: Secondary | ICD-10-CM | POA: Diagnosis not present

## 2016-08-02 DIAGNOSIS — S62102D Fracture of unspecified carpal bone, left wrist, subsequent encounter for fracture with routine healing: Secondary | ICD-10-CM | POA: Diagnosis not present

## 2016-08-02 DIAGNOSIS — G309 Alzheimer's disease, unspecified: Secondary | ICD-10-CM | POA: Diagnosis not present

## 2016-08-03 DIAGNOSIS — S62102D Fracture of unspecified carpal bone, left wrist, subsequent encounter for fracture with routine healing: Secondary | ICD-10-CM | POA: Diagnosis not present

## 2016-08-03 DIAGNOSIS — F0280 Dementia in other diseases classified elsewhere without behavioral disturbance: Secondary | ICD-10-CM | POA: Diagnosis not present

## 2016-08-03 DIAGNOSIS — G309 Alzheimer's disease, unspecified: Secondary | ICD-10-CM | POA: Diagnosis not present

## 2016-08-03 DIAGNOSIS — F17219 Nicotine dependence, cigarettes, with unspecified nicotine-induced disorders: Secondary | ICD-10-CM | POA: Diagnosis not present

## 2016-08-03 DIAGNOSIS — W19XXXD Unspecified fall, subsequent encounter: Secondary | ICD-10-CM | POA: Diagnosis not present

## 2016-08-03 DIAGNOSIS — S42002D Fracture of unspecified part of left clavicle, subsequent encounter for fracture with routine healing: Secondary | ICD-10-CM | POA: Diagnosis not present

## 2016-08-04 DIAGNOSIS — S42002D Fracture of unspecified part of left clavicle, subsequent encounter for fracture with routine healing: Secondary | ICD-10-CM | POA: Diagnosis not present

## 2016-08-04 DIAGNOSIS — F17219 Nicotine dependence, cigarettes, with unspecified nicotine-induced disorders: Secondary | ICD-10-CM | POA: Diagnosis not present

## 2016-08-04 DIAGNOSIS — F0280 Dementia in other diseases classified elsewhere without behavioral disturbance: Secondary | ICD-10-CM | POA: Diagnosis not present

## 2016-08-04 DIAGNOSIS — W19XXXD Unspecified fall, subsequent encounter: Secondary | ICD-10-CM | POA: Diagnosis not present

## 2016-08-04 DIAGNOSIS — G309 Alzheimer's disease, unspecified: Secondary | ICD-10-CM | POA: Diagnosis not present

## 2016-08-04 DIAGNOSIS — S62102D Fracture of unspecified carpal bone, left wrist, subsequent encounter for fracture with routine healing: Secondary | ICD-10-CM | POA: Diagnosis not present

## 2016-08-08 DIAGNOSIS — W19XXXD Unspecified fall, subsequent encounter: Secondary | ICD-10-CM | POA: Diagnosis not present

## 2016-08-08 DIAGNOSIS — F17219 Nicotine dependence, cigarettes, with unspecified nicotine-induced disorders: Secondary | ICD-10-CM | POA: Diagnosis not present

## 2016-08-08 DIAGNOSIS — S42002D Fracture of unspecified part of left clavicle, subsequent encounter for fracture with routine healing: Secondary | ICD-10-CM | POA: Diagnosis not present

## 2016-08-08 DIAGNOSIS — F0280 Dementia in other diseases classified elsewhere without behavioral disturbance: Secondary | ICD-10-CM | POA: Diagnosis not present

## 2016-08-08 DIAGNOSIS — S62102D Fracture of unspecified carpal bone, left wrist, subsequent encounter for fracture with routine healing: Secondary | ICD-10-CM | POA: Diagnosis not present

## 2016-08-08 DIAGNOSIS — G309 Alzheimer's disease, unspecified: Secondary | ICD-10-CM | POA: Diagnosis not present

## 2016-08-09 DIAGNOSIS — S62102D Fracture of unspecified carpal bone, left wrist, subsequent encounter for fracture with routine healing: Secondary | ICD-10-CM | POA: Diagnosis not present

## 2016-08-09 DIAGNOSIS — G309 Alzheimer's disease, unspecified: Secondary | ICD-10-CM | POA: Diagnosis not present

## 2016-08-09 DIAGNOSIS — S42002D Fracture of unspecified part of left clavicle, subsequent encounter for fracture with routine healing: Secondary | ICD-10-CM | POA: Diagnosis not present

## 2016-08-09 DIAGNOSIS — F0280 Dementia in other diseases classified elsewhere without behavioral disturbance: Secondary | ICD-10-CM | POA: Diagnosis not present

## 2016-08-09 DIAGNOSIS — F17219 Nicotine dependence, cigarettes, with unspecified nicotine-induced disorders: Secondary | ICD-10-CM | POA: Diagnosis not present

## 2016-08-09 DIAGNOSIS — W19XXXD Unspecified fall, subsequent encounter: Secondary | ICD-10-CM | POA: Diagnosis not present

## 2016-08-10 DIAGNOSIS — F17219 Nicotine dependence, cigarettes, with unspecified nicotine-induced disorders: Secondary | ICD-10-CM | POA: Diagnosis not present

## 2016-08-10 DIAGNOSIS — W19XXXD Unspecified fall, subsequent encounter: Secondary | ICD-10-CM | POA: Diagnosis not present

## 2016-08-10 DIAGNOSIS — F0280 Dementia in other diseases classified elsewhere without behavioral disturbance: Secondary | ICD-10-CM | POA: Diagnosis not present

## 2016-08-10 DIAGNOSIS — S62102D Fracture of unspecified carpal bone, left wrist, subsequent encounter for fracture with routine healing: Secondary | ICD-10-CM | POA: Diagnosis not present

## 2016-08-10 DIAGNOSIS — S42002D Fracture of unspecified part of left clavicle, subsequent encounter for fracture with routine healing: Secondary | ICD-10-CM | POA: Diagnosis not present

## 2016-08-10 DIAGNOSIS — G309 Alzheimer's disease, unspecified: Secondary | ICD-10-CM | POA: Diagnosis not present

## 2016-08-11 DIAGNOSIS — W19XXXD Unspecified fall, subsequent encounter: Secondary | ICD-10-CM | POA: Diagnosis not present

## 2016-08-11 DIAGNOSIS — F0280 Dementia in other diseases classified elsewhere without behavioral disturbance: Secondary | ICD-10-CM | POA: Diagnosis not present

## 2016-08-11 DIAGNOSIS — F17219 Nicotine dependence, cigarettes, with unspecified nicotine-induced disorders: Secondary | ICD-10-CM | POA: Diagnosis not present

## 2016-08-11 DIAGNOSIS — G309 Alzheimer's disease, unspecified: Secondary | ICD-10-CM | POA: Diagnosis not present

## 2016-08-11 DIAGNOSIS — S62102D Fracture of unspecified carpal bone, left wrist, subsequent encounter for fracture with routine healing: Secondary | ICD-10-CM | POA: Diagnosis not present

## 2016-08-11 DIAGNOSIS — S42002D Fracture of unspecified part of left clavicle, subsequent encounter for fracture with routine healing: Secondary | ICD-10-CM | POA: Diagnosis not present

## 2016-08-12 DIAGNOSIS — F0280 Dementia in other diseases classified elsewhere without behavioral disturbance: Secondary | ICD-10-CM | POA: Diagnosis not present

## 2016-08-12 DIAGNOSIS — G309 Alzheimer's disease, unspecified: Secondary | ICD-10-CM | POA: Diagnosis not present

## 2016-08-12 DIAGNOSIS — S62102D Fracture of unspecified carpal bone, left wrist, subsequent encounter for fracture with routine healing: Secondary | ICD-10-CM | POA: Diagnosis not present

## 2016-08-12 DIAGNOSIS — S42002D Fracture of unspecified part of left clavicle, subsequent encounter for fracture with routine healing: Secondary | ICD-10-CM | POA: Diagnosis not present

## 2016-08-12 DIAGNOSIS — W19XXXD Unspecified fall, subsequent encounter: Secondary | ICD-10-CM | POA: Diagnosis not present

## 2016-08-12 DIAGNOSIS — F17219 Nicotine dependence, cigarettes, with unspecified nicotine-induced disorders: Secondary | ICD-10-CM | POA: Diagnosis not present

## 2016-08-16 DIAGNOSIS — W19XXXD Unspecified fall, subsequent encounter: Secondary | ICD-10-CM | POA: Diagnosis not present

## 2016-08-16 DIAGNOSIS — S62102D Fracture of unspecified carpal bone, left wrist, subsequent encounter for fracture with routine healing: Secondary | ICD-10-CM | POA: Diagnosis not present

## 2016-08-16 DIAGNOSIS — S42002D Fracture of unspecified part of left clavicle, subsequent encounter for fracture with routine healing: Secondary | ICD-10-CM | POA: Diagnosis not present

## 2016-08-16 DIAGNOSIS — F17219 Nicotine dependence, cigarettes, with unspecified nicotine-induced disorders: Secondary | ICD-10-CM | POA: Diagnosis not present

## 2016-08-16 DIAGNOSIS — F0280 Dementia in other diseases classified elsewhere without behavioral disturbance: Secondary | ICD-10-CM | POA: Diagnosis not present

## 2016-08-16 DIAGNOSIS — G309 Alzheimer's disease, unspecified: Secondary | ICD-10-CM | POA: Diagnosis not present

## 2016-08-17 DIAGNOSIS — F17219 Nicotine dependence, cigarettes, with unspecified nicotine-induced disorders: Secondary | ICD-10-CM | POA: Diagnosis not present

## 2016-08-17 DIAGNOSIS — S52502A Unspecified fracture of the lower end of left radius, initial encounter for closed fracture: Secondary | ICD-10-CM | POA: Diagnosis not present

## 2016-08-17 DIAGNOSIS — F0280 Dementia in other diseases classified elsewhere without behavioral disturbance: Secondary | ICD-10-CM | POA: Diagnosis not present

## 2016-08-17 DIAGNOSIS — G309 Alzheimer's disease, unspecified: Secondary | ICD-10-CM | POA: Diagnosis not present

## 2016-08-17 DIAGNOSIS — S42002D Fracture of unspecified part of left clavicle, subsequent encounter for fracture with routine healing: Secondary | ICD-10-CM | POA: Diagnosis not present

## 2016-08-17 DIAGNOSIS — W19XXXD Unspecified fall, subsequent encounter: Secondary | ICD-10-CM | POA: Diagnosis not present

## 2016-08-17 DIAGNOSIS — S62102D Fracture of unspecified carpal bone, left wrist, subsequent encounter for fracture with routine healing: Secondary | ICD-10-CM | POA: Diagnosis not present

## 2016-08-18 DIAGNOSIS — W19XXXD Unspecified fall, subsequent encounter: Secondary | ICD-10-CM | POA: Diagnosis not present

## 2016-08-18 DIAGNOSIS — S62102D Fracture of unspecified carpal bone, left wrist, subsequent encounter for fracture with routine healing: Secondary | ICD-10-CM | POA: Diagnosis not present

## 2016-08-18 DIAGNOSIS — G309 Alzheimer's disease, unspecified: Secondary | ICD-10-CM | POA: Diagnosis not present

## 2016-08-18 DIAGNOSIS — F0280 Dementia in other diseases classified elsewhere without behavioral disturbance: Secondary | ICD-10-CM | POA: Diagnosis not present

## 2016-08-18 DIAGNOSIS — S42002D Fracture of unspecified part of left clavicle, subsequent encounter for fracture with routine healing: Secondary | ICD-10-CM | POA: Diagnosis not present

## 2016-08-18 DIAGNOSIS — F17219 Nicotine dependence, cigarettes, with unspecified nicotine-induced disorders: Secondary | ICD-10-CM | POA: Diagnosis not present

## 2016-08-22 DIAGNOSIS — Z681 Body mass index (BMI) 19 or less, adult: Secondary | ICD-10-CM | POA: Diagnosis not present

## 2016-08-22 DIAGNOSIS — R350 Frequency of micturition: Secondary | ICD-10-CM | POA: Diagnosis not present

## 2016-08-22 DIAGNOSIS — G301 Alzheimer's disease with late onset: Secondary | ICD-10-CM | POA: Diagnosis not present

## 2016-08-22 DIAGNOSIS — F411 Generalized anxiety disorder: Secondary | ICD-10-CM | POA: Diagnosis not present

## 2016-08-22 DIAGNOSIS — S42002A Fracture of unspecified part of left clavicle, initial encounter for closed fracture: Secondary | ICD-10-CM | POA: Diagnosis not present

## 2016-08-22 DIAGNOSIS — Z720 Tobacco use: Secondary | ICD-10-CM | POA: Diagnosis not present

## 2016-08-23 DIAGNOSIS — W19XXXD Unspecified fall, subsequent encounter: Secondary | ICD-10-CM | POA: Diagnosis not present

## 2016-08-23 DIAGNOSIS — S42002D Fracture of unspecified part of left clavicle, subsequent encounter for fracture with routine healing: Secondary | ICD-10-CM | POA: Diagnosis not present

## 2016-08-23 DIAGNOSIS — S62102D Fracture of unspecified carpal bone, left wrist, subsequent encounter for fracture with routine healing: Secondary | ICD-10-CM | POA: Diagnosis not present

## 2016-08-23 DIAGNOSIS — F17219 Nicotine dependence, cigarettes, with unspecified nicotine-induced disorders: Secondary | ICD-10-CM | POA: Diagnosis not present

## 2016-08-23 DIAGNOSIS — G309 Alzheimer's disease, unspecified: Secondary | ICD-10-CM | POA: Diagnosis not present

## 2016-08-23 DIAGNOSIS — F0280 Dementia in other diseases classified elsewhere without behavioral disturbance: Secondary | ICD-10-CM | POA: Diagnosis not present

## 2016-08-24 DIAGNOSIS — G309 Alzheimer's disease, unspecified: Secondary | ICD-10-CM | POA: Diagnosis not present

## 2016-08-24 DIAGNOSIS — W19XXXD Unspecified fall, subsequent encounter: Secondary | ICD-10-CM | POA: Diagnosis not present

## 2016-08-24 DIAGNOSIS — F17219 Nicotine dependence, cigarettes, with unspecified nicotine-induced disorders: Secondary | ICD-10-CM | POA: Diagnosis not present

## 2016-08-24 DIAGNOSIS — S42002D Fracture of unspecified part of left clavicle, subsequent encounter for fracture with routine healing: Secondary | ICD-10-CM | POA: Diagnosis not present

## 2016-08-24 DIAGNOSIS — S62102D Fracture of unspecified carpal bone, left wrist, subsequent encounter for fracture with routine healing: Secondary | ICD-10-CM | POA: Diagnosis not present

## 2016-08-24 DIAGNOSIS — F0280 Dementia in other diseases classified elsewhere without behavioral disturbance: Secondary | ICD-10-CM | POA: Diagnosis not present

## 2016-08-26 DIAGNOSIS — G309 Alzheimer's disease, unspecified: Secondary | ICD-10-CM | POA: Diagnosis not present

## 2016-08-26 DIAGNOSIS — W19XXXD Unspecified fall, subsequent encounter: Secondary | ICD-10-CM | POA: Diagnosis not present

## 2016-08-26 DIAGNOSIS — F0280 Dementia in other diseases classified elsewhere without behavioral disturbance: Secondary | ICD-10-CM | POA: Diagnosis not present

## 2016-08-26 DIAGNOSIS — S42002D Fracture of unspecified part of left clavicle, subsequent encounter for fracture with routine healing: Secondary | ICD-10-CM | POA: Diagnosis not present

## 2016-08-26 DIAGNOSIS — F17219 Nicotine dependence, cigarettes, with unspecified nicotine-induced disorders: Secondary | ICD-10-CM | POA: Diagnosis not present

## 2016-08-26 DIAGNOSIS — S62102D Fracture of unspecified carpal bone, left wrist, subsequent encounter for fracture with routine healing: Secondary | ICD-10-CM | POA: Diagnosis not present

## 2016-08-29 DIAGNOSIS — S42002D Fracture of unspecified part of left clavicle, subsequent encounter for fracture with routine healing: Secondary | ICD-10-CM | POA: Diagnosis not present

## 2016-08-29 DIAGNOSIS — S62102D Fracture of unspecified carpal bone, left wrist, subsequent encounter for fracture with routine healing: Secondary | ICD-10-CM | POA: Diagnosis not present

## 2016-08-29 DIAGNOSIS — W19XXXD Unspecified fall, subsequent encounter: Secondary | ICD-10-CM | POA: Diagnosis not present

## 2016-08-29 DIAGNOSIS — F17219 Nicotine dependence, cigarettes, with unspecified nicotine-induced disorders: Secondary | ICD-10-CM | POA: Diagnosis not present

## 2016-08-29 DIAGNOSIS — G309 Alzheimer's disease, unspecified: Secondary | ICD-10-CM | POA: Diagnosis not present

## 2016-08-29 DIAGNOSIS — F0280 Dementia in other diseases classified elsewhere without behavioral disturbance: Secondary | ICD-10-CM | POA: Diagnosis not present

## 2016-08-31 DIAGNOSIS — G309 Alzheimer's disease, unspecified: Secondary | ICD-10-CM | POA: Diagnosis not present

## 2016-08-31 DIAGNOSIS — W19XXXD Unspecified fall, subsequent encounter: Secondary | ICD-10-CM | POA: Diagnosis not present

## 2016-08-31 DIAGNOSIS — S42002D Fracture of unspecified part of left clavicle, subsequent encounter for fracture with routine healing: Secondary | ICD-10-CM | POA: Diagnosis not present

## 2016-08-31 DIAGNOSIS — S62102D Fracture of unspecified carpal bone, left wrist, subsequent encounter for fracture with routine healing: Secondary | ICD-10-CM | POA: Diagnosis not present

## 2016-08-31 DIAGNOSIS — F17219 Nicotine dependence, cigarettes, with unspecified nicotine-induced disorders: Secondary | ICD-10-CM | POA: Diagnosis not present

## 2016-08-31 DIAGNOSIS — F0280 Dementia in other diseases classified elsewhere without behavioral disturbance: Secondary | ICD-10-CM | POA: Diagnosis not present

## 2016-09-02 DIAGNOSIS — S62102D Fracture of unspecified carpal bone, left wrist, subsequent encounter for fracture with routine healing: Secondary | ICD-10-CM | POA: Diagnosis not present

## 2016-09-02 DIAGNOSIS — F0280 Dementia in other diseases classified elsewhere without behavioral disturbance: Secondary | ICD-10-CM | POA: Diagnosis not present

## 2016-09-02 DIAGNOSIS — S42002D Fracture of unspecified part of left clavicle, subsequent encounter for fracture with routine healing: Secondary | ICD-10-CM | POA: Diagnosis not present

## 2016-09-02 DIAGNOSIS — W19XXXD Unspecified fall, subsequent encounter: Secondary | ICD-10-CM | POA: Diagnosis not present

## 2016-09-02 DIAGNOSIS — G309 Alzheimer's disease, unspecified: Secondary | ICD-10-CM | POA: Diagnosis not present

## 2016-09-02 DIAGNOSIS — F17219 Nicotine dependence, cigarettes, with unspecified nicotine-induced disorders: Secondary | ICD-10-CM | POA: Diagnosis not present

## 2016-09-08 DIAGNOSIS — W19XXXD Unspecified fall, subsequent encounter: Secondary | ICD-10-CM | POA: Diagnosis not present

## 2016-09-08 DIAGNOSIS — F17219 Nicotine dependence, cigarettes, with unspecified nicotine-induced disorders: Secondary | ICD-10-CM | POA: Diagnosis not present

## 2016-09-08 DIAGNOSIS — S62102D Fracture of unspecified carpal bone, left wrist, subsequent encounter for fracture with routine healing: Secondary | ICD-10-CM | POA: Diagnosis not present

## 2016-09-08 DIAGNOSIS — G309 Alzheimer's disease, unspecified: Secondary | ICD-10-CM | POA: Diagnosis not present

## 2016-09-08 DIAGNOSIS — S42002D Fracture of unspecified part of left clavicle, subsequent encounter for fracture with routine healing: Secondary | ICD-10-CM | POA: Diagnosis not present

## 2016-09-08 DIAGNOSIS — F0280 Dementia in other diseases classified elsewhere without behavioral disturbance: Secondary | ICD-10-CM | POA: Diagnosis not present

## 2016-09-13 DIAGNOSIS — M1990 Unspecified osteoarthritis, unspecified site: Secondary | ICD-10-CM | POA: Diagnosis not present

## 2016-09-13 DIAGNOSIS — F17200 Nicotine dependence, unspecified, uncomplicated: Secondary | ICD-10-CM | POA: Diagnosis not present

## 2016-09-13 DIAGNOSIS — H1032 Unspecified acute conjunctivitis, left eye: Secondary | ICD-10-CM | POA: Diagnosis not present

## 2016-09-13 DIAGNOSIS — F0390 Unspecified dementia without behavioral disturbance: Secondary | ICD-10-CM | POA: Diagnosis not present

## 2016-09-13 DIAGNOSIS — Z79899 Other long term (current) drug therapy: Secondary | ICD-10-CM | POA: Diagnosis not present

## 2016-09-16 DIAGNOSIS — F0280 Dementia in other diseases classified elsewhere without behavioral disturbance: Secondary | ICD-10-CM | POA: Diagnosis not present

## 2016-09-16 DIAGNOSIS — G309 Alzheimer's disease, unspecified: Secondary | ICD-10-CM | POA: Diagnosis not present

## 2016-09-16 DIAGNOSIS — W19XXXD Unspecified fall, subsequent encounter: Secondary | ICD-10-CM | POA: Diagnosis not present

## 2016-09-16 DIAGNOSIS — S62102D Fracture of unspecified carpal bone, left wrist, subsequent encounter for fracture with routine healing: Secondary | ICD-10-CM | POA: Diagnosis not present

## 2016-09-16 DIAGNOSIS — S42002D Fracture of unspecified part of left clavicle, subsequent encounter for fracture with routine healing: Secondary | ICD-10-CM | POA: Diagnosis not present

## 2016-09-16 DIAGNOSIS — F17219 Nicotine dependence, cigarettes, with unspecified nicotine-induced disorders: Secondary | ICD-10-CM | POA: Diagnosis not present

## 2016-10-03 DIAGNOSIS — T1502XA Foreign body in cornea, left eye, initial encounter: Secondary | ICD-10-CM | POA: Diagnosis not present

## 2016-10-04 DIAGNOSIS — S52502A Unspecified fracture of the lower end of left radius, initial encounter for closed fracture: Secondary | ICD-10-CM | POA: Diagnosis not present

## 2016-11-03 DIAGNOSIS — F5101 Primary insomnia: Secondary | ICD-10-CM | POA: Diagnosis not present

## 2016-11-03 DIAGNOSIS — Z0001 Encounter for general adult medical examination with abnormal findings: Secondary | ICD-10-CM | POA: Diagnosis not present

## 2016-11-03 DIAGNOSIS — F0281 Dementia in other diseases classified elsewhere with behavioral disturbance: Secondary | ICD-10-CM | POA: Diagnosis not present

## 2016-11-03 DIAGNOSIS — F411 Generalized anxiety disorder: Secondary | ICD-10-CM | POA: Diagnosis not present

## 2016-11-03 DIAGNOSIS — Z23 Encounter for immunization: Secondary | ICD-10-CM | POA: Diagnosis not present

## 2016-11-03 DIAGNOSIS — R300 Dysuria: Secondary | ICD-10-CM | POA: Diagnosis not present

## 2016-11-03 DIAGNOSIS — Z681 Body mass index (BMI) 19 or less, adult: Secondary | ICD-10-CM | POA: Diagnosis not present

## 2016-11-03 DIAGNOSIS — K5901 Slow transit constipation: Secondary | ICD-10-CM | POA: Diagnosis not present

## 2016-11-03 DIAGNOSIS — G912 (Idiopathic) normal pressure hydrocephalus: Secondary | ICD-10-CM | POA: Diagnosis not present

## 2017-01-03 ENCOUNTER — Ambulatory Visit (INDEPENDENT_AMBULATORY_CARE_PROVIDER_SITE_OTHER): Payer: Medicare Other | Admitting: Neurology

## 2017-01-03 ENCOUNTER — Telehealth: Payer: Self-pay | Admitting: Neurology

## 2017-01-03 ENCOUNTER — Encounter: Payer: Self-pay | Admitting: Neurology

## 2017-01-03 VITALS — BP 113/69 | HR 85 | Ht 64.0 in | Wt 108.0 lb

## 2017-01-03 DIAGNOSIS — F0390 Unspecified dementia without behavioral disturbance: Secondary | ICD-10-CM

## 2017-01-03 MED ORDER — DONEPEZIL HCL 10 MG PO TABS: 10.0000 mg | ORAL_TABLET | Freq: Every day | ORAL | 11 refills | Status: AC

## 2017-01-03 MED ORDER — MEMANTINE HCL 10 MG PO TABS: 10.0000 mg | ORAL_TABLET | Freq: Two times a day (BID) | ORAL | 12 refills | Status: AC

## 2017-01-03 NOTE — Patient Instructions (Signed)
Dementia Dementia is the loss of two or more brain functions, such as:  Memory.  Decision making.  Behavior.  Speaking.  Thinking.  Problem solving.  There are many types of dementia. The most common type is called progressive dementia. Progressive dementia gets worse with time and it is irreversible. An example of this type of dementia is Alzheimer disease. What are the causes? This condition may be caused by:  Nerve cell damage in the brain.  Genetic mutations.  Certain medicines.  Multiple small strokes.  An infection, such as chronic meningitis.  A metabolic problem, such as vitamin B12 deficiency or thyroid disease.  Pressure on the brain, such as from a tumor or blood clot.  What are the signs or symptoms? Symptoms of this condition include:  Sudden changes in mood.  Depression.  Problems with balance.  Changes in personality.  Poor short-term memory.  Agitation.  Delusions.  Hallucinations.  Having a hard time: ? Speaking thoughts. ? Finding words. ? Solving problems. ? Doing familiar tasks. ? Understanding familiar ideas.  How is this diagnosed? This condition is diagnosed with an assessment by your health care provider. During this assessment, your health care provider will talk with you and your family, friends, or caregivers about your symptoms. A thorough medical history will be taken, and you will have a physical exam and tests. Tests may include:  Lab tests, such as blood or urine tests.  Imaging tests, such as a CT scan, PET scan, or MRI.  A lumbar puncture. This test involves removing and testing a small amount of the fluid that surrounds the brain and spinal cord.  An electroencephalogram (EEG). In this test, small metal discs are used to measure electrical activity in the brain.  Memory tests, cognitive tests, and neuropsychological tests. These tests evaluate brain function.  How is this treated? Treatment depends on the  cause of the dementia. It may involve taking medicines that may help:  To control the dementia.  To slow down the disease.  To manage symptoms.  In some cases, treating the cause of the dementia can improve symptoms, reverse symptoms, or slow down how quickly the dementia gets worse. Your health care provider can help direct you to support groups, organizations, and other health care providers who can help with decisions about your care. Follow these instructions at home: Medicine  Take over-the-counter and prescription medicines only as told by your health care provider.  Avoid taking medicines that can affect thinking, such as pain or sleeping medicines. Lifestyle   Make healthy lifestyle choices: ? Be physically active as told by your health care provider. ? Do not use any tobacco products, such as cigarettes, chewing tobacco, and e-cigarettes. If you need help quitting, ask your health care provider. ? Eat a healthy diet. ? Practice stress-management techniques when you get stressed. ? Stay social.  Drink enough fluid to keep your urine clear or pale yellow.  Make sure to get quality sleep. These tips can help you to get a good night's rest: ? Avoid napping during the day. ? Keep your sleeping area dark and cool. ? Avoid exercising during the few hours before you go to bed. ? Avoid caffeine products in the evening. General instructions  Work with your health care provider to determine what you need help with and what your safety needs are.  If you were given a bracelet that tracks your location, make sure to wear it.  Keep all follow-up visits as told by your   health care provider. This is important. Contact a health care provider if:  You have any new symptoms.  You have problems with choking or swallowing.  You have any symptoms of a different illness. Get help right away if:  You develop a fever.  You have new or worsening confusion.  You have new or  worsening sleepiness.  You have a hard time staying awake.  You or your family members become concerned for your safety. This information is not intended to replace advice given to you by your health care provider. Make sure you discuss any questions you have with your health care provider. Document Released: 08/03/2000 Document Revised: 06/18/2015 Document Reviewed: 11/05/2014 [employer]vier Interactive Patient Education  2017 [employer]  

## 2017-01-03 NOTE — Progress Notes (Signed)
[facility]    Provider:  Dr Jaynee Eagles Referring Provider: Manon Hilding, MD Primary Care Physician:  Manon Hilding, MD   Interval history 01/03/2017: Patient with recurrent falls, dementia, admitted to SNF for therapy in May and nursing care, stays very confused and continues to be be at high risk for falls. On Aricept. Namenda discontinued. In May her wbcs elevated. She was discharged back home from SNF in May with home therapy.  No falls since last being seen. She is home with family. Sons here. She is "honery" but no severe irritation or mood disorder. Patient says her memory is fine. Sons think the memory loss is progressive. She walks with a walker.    Interval history 07/07/2016: Patient returns today after high volume LP to evaluate for normal pressure hydrocephalus. No improvement in gait or cognitive abilities. She fell last Wednesday and broke her wrist. She was climbing the steps and fell on the floor. This was 8am. Discussed a home safety inspection. They are trying to keep her on the main floor and they have help a few times a week.   OZY:YQMG Pruittis a 76 y.o.femalehere as a referral from Dr. Koleen Distance dementia. MRI of the brain showed marketed ventriculomegaly. She is on Aricept. She is a current every day smoker. Past medical history of anxiety and depression.Patient is here for son and daughter. Patient says she can't remember things doesn't know when it started. She can't give anymore details. Here with a friend and her son. Her gait is impaired she has difficulty going up steps. She tells stories of things that did not happen. She gets people mixed up, she thought her primary doctor was a Environmental education officer. She often mistakes her son for her brother. She has periods of agitation, being mean. Symptoms started a year ago, progressive. She went to the hospital with a UTI 2 months ago and since then worse. She wsa very agitated in the hospital and needed sedation or a  24x7 sitter. They started Geodon in the hospital before she had the MRI. She can sign her name to a check but can't manage finances. She lives in a home. Son is there all day long. She has difficulty using the microwave and seeing the numbers. She has delusions, she thinks someone else is living with them. She obsesses over the dog. She has difficulty with the date, repeating the same stories or questions in 5 minutes. She thinks she has to perform tasks   Reviewed notes, labs and imaging from outside physicians, which showed:  Reviewed MRI report and also images brought on CD by family and agree with the following. No acute stroke, acute hemorrhage, mass lesion, or extra-axial fluid. Marked ventriculomegaly, particularly affecting the lateral and third ventricles. His disproportionate lack of enlargement of the cortical sulci. Normal pressure hydrocephalus is not excluded. In addition to the findings which are consistent with chronic microvascular ischemic change, there is fairly continuous T2 FLAIR hyperintensity surrounding the ventricles which could represent transependymal absorption. Partially empty sella. Impression marked ventriculomegaly could represent normal pressure hydrocephalus. Chronic microvascular ischemic changes may be superimposed. There is a possibility of cortical atrophy. Consider lumbar puncture for further evaluation.  CMP 03/01/2016 with BUN 12 and creatinine 1.07, EGFR slightly reduced. CBC was unremarkable. TSH 3.2. RPR nonreactive. B12 and folate normal, B12 686.  [facility]. It appears as though she was admitted for Escherichia coli UTI,   Review of Systems: Patient complains of symptoms per HPI  as well as the following symptoms: memory changes, confusion. Pertinent negatives per HPI. All others negative  Social History   Socioeconomic History  . Marital status: Divorced    Spouse name: Not on file  . Number of children: 2  .  Years of education: Nursing school  . Highest education level: Not on file  Social Needs  . Financial resource strain: Not on file  . Food insecurity - worry: Not on file  . Food insecurity - inability: Not on file  . Transportation needs - medical: Not on file  . Transportation needs - non-medical: Not on file  Occupational History  . Occupation: Retired  Tobacco Use  . Smoking status: Current Every Day Smoker  . Smokeless tobacco: Current User  . Tobacco comment: Occasional  Substance and Sexual Activity  . Alcohol use: No  . Drug use: No  . Sexual activity: Not on file  Other Topics Concern  . Not on file  Social History Narrative   Lives w/ her son   Right-handed   Caffeine: 2 cups per day    Family History  Problem Relation Age of Onset  . Breast cancer Mother   . Stroke Father   . Dementia Neg Hx     Past Medical History:  Diagnosis Date  . Clavicle fracture   . Fall   . Memory loss     Past Surgical History:  Procedure Laterality Date  . TUBAL LIGATION    . WRIST FRACTURE SURGERY Left     Current Outpatient Medications  Medication Sig Dispense Refill  . amitriptyline (ELAVIL) 25 MG tablet Take 25 mg by mouth at bedtime.  3  . donepezil (ARICEPT) 10 MG tablet Take 1 tablet (10 mg total) daily by mouth. 30 tablet 11  . polyethylene glycol (MIRALAX / GLYCOLAX) packet Take 17 g by mouth daily.    . memantine (NAMENDA) 10 MG tablet Take 1 tablet (10 mg total) 2 (two) times daily by mouth. 60 tablet 12   No current facility-administered medications for this visit.     Allergies as of 01/03/2017  . (No Known Allergies)    Vitals: BP 113/69   Pulse 85   Ht 5' 4"  (1.626 m)   Wt 108 lb (49 kg)   BMI 18.54 kg/m  Last Weight:  Wt Readings from Last 1 Encounters:  01/03/17 108 lb (49 kg)   Last Height:   Ht Readings from Last 1 Encounters:  01/03/17 5' 4"  (1.626 m)   MMSE - Mini Mental State Exam 07/07/2016 06/21/2016  Orientation to time 0 3    Orientation to Place 2 2  Registration 3 3  Attention/ Calculation 5 4  Recall 0 0  Language- name 2 objects 1 2  Language- repeat 1 1  Language- follow 3 step command 0 2  Language- read & follow direction 0 0  Write a sentence 0 0  Copy design 1 0  Total score 13 17    The patient is oriented to person recent and remote memory impaired;  language fluent;  Impaired attention, concentration, fund of knowledge Cranial Nerves: The pupils are equal, round, and reactive to light. Attempted funduscopic exam could not visualize due to patient cooperation.. Visual fields are full to finger confrontation. Exotropia Extraocular movements are intact. Trigeminal sensation is intact and the muscles of mastication are normal. The face is symmetric. The palate elevates in the midline. Hearing intact. Voice is normal. Shoulder shrug is normal. The tongue has normal  motion without fasciculations.   Coordination: Bradykinetic, no dymetria noted but limited exam because she had difficulty understanding the exam   Gait: Magnetic gait with mild stooped posture  Motor Observation: no involuntary movements noted. Tone: Cogwheelig in the uppers  Posture: Posture is mildly stooped  Strength: antigravity and equal, attempted could not perform full motor exam due to patient's inability to comprehend the instructions.   Sensation: intact to LT  Reflex Exam:  DTR's: Deep tendon reflexes in the upper and lower extremities are brisk bilaterally.  Toes: The toes are downgoing bilaterally.  Clonus: Clonus is absent.   Assessment/Plan:76 year old dementia and marked ventriculomegaly suspicious for NPH. Her gait is magnetic. Mini-Mental status exam 13  out of 30.  Unfortunately there was no improvement after high volume lumbar puncture; no improvement in cognition or in gait. Family reports that she is actually worsened since a fall.  - Continue  Aricept. We'll start Namenda as well.  - I would minimize use of geodon and amitriptyline, if possible,in this 76 year old patient with memory disorder. Both of these medications can cause cognitive changes especially in the elderly. Will defer to PCP. However often times neuroleptics are used in the elderly but advised that can be significant side effects, they are not FDA approved for use in the elderly, and there is a black box warning for significant morbidity or mortality in the elderly. But often times benefits can outweigh the risks of atypical antipsychotics.   Cc: Dr. Quintin Alto  Mri head from [facility] (see scanned) 04/2016 showed marked ventriculomegaly, microvascular ischemia  Sarina Ill, MD  [facility] [address] [place], [place] [address]  Phone [phone] Fax [phone]  A total of 15 minutes was spent face-to-face with this patient. Over half this time was spent on counseling patient on the dementia diagnosis and different diagnostic and therapeutic options available.

## 2017-01-03 NOTE — Telephone Encounter (Signed)
Pt wants to call back to sched 1 yr f/u

## 2017-01-09 ENCOUNTER — Ambulatory Visit: Payer: Medicare Other | Admitting: Adult Health

## 2017-03-01 DIAGNOSIS — M25561 Pain in right knee: Secondary | ICD-10-CM | POA: Diagnosis not present

## 2017-03-01 DIAGNOSIS — M25562 Pain in left knee: Secondary | ICD-10-CM | POA: Diagnosis not present

## 2017-03-08 DIAGNOSIS — F5101 Primary insomnia: Secondary | ICD-10-CM | POA: Diagnosis not present

## 2017-03-08 DIAGNOSIS — F411 Generalized anxiety disorder: Secondary | ICD-10-CM | POA: Diagnosis not present

## 2017-03-08 DIAGNOSIS — F0281 Dementia in other diseases classified elsewhere with behavioral disturbance: Secondary | ICD-10-CM | POA: Diagnosis not present

## 2017-03-08 DIAGNOSIS — G912 (Idiopathic) normal pressure hydrocephalus: Secondary | ICD-10-CM | POA: Diagnosis not present

## 2017-03-08 DIAGNOSIS — K5901 Slow transit constipation: Secondary | ICD-10-CM | POA: Diagnosis not present

## 2017-03-08 DIAGNOSIS — Z681 Body mass index (BMI) 19 or less, adult: Secondary | ICD-10-CM | POA: Diagnosis not present

## 2017-03-23 ENCOUNTER — Ambulatory Visit: Payer: Medicare Other | Admitting: Adult Health

## 2017-03-29 DIAGNOSIS — R2689 Other abnormalities of gait and mobility: Secondary | ICD-10-CM | POA: Diagnosis not present

## 2017-03-29 DIAGNOSIS — G40409 Other generalized epilepsy and epileptic syndromes, not intractable, without status epilepticus: Secondary | ICD-10-CM | POA: Diagnosis not present

## 2017-03-29 DIAGNOSIS — G40909 Epilepsy, unspecified, not intractable, without status epilepticus: Secondary | ICD-10-CM | POA: Diagnosis not present

## 2017-03-29 DIAGNOSIS — Z87440 Personal history of urinary (tract) infections: Secondary | ICD-10-CM | POA: Diagnosis not present

## 2017-03-29 DIAGNOSIS — R739 Hyperglycemia, unspecified: Secondary | ICD-10-CM | POA: Diagnosis not present

## 2017-03-29 DIAGNOSIS — G9341 Metabolic encephalopathy: Secondary | ICD-10-CM | POA: Diagnosis not present

## 2017-03-29 DIAGNOSIS — R569 Unspecified convulsions: Secondary | ICD-10-CM | POA: Diagnosis not present

## 2017-03-29 DIAGNOSIS — G912 (Idiopathic) normal pressure hydrocephalus: Secondary | ICD-10-CM | POA: Diagnosis not present

## 2017-03-29 DIAGNOSIS — Z886 Allergy status to analgesic agent status: Secondary | ICD-10-CM | POA: Diagnosis not present

## 2017-03-29 DIAGNOSIS — F17200 Nicotine dependence, unspecified, uncomplicated: Secondary | ICD-10-CM | POA: Diagnosis not present

## 2017-03-29 DIAGNOSIS — M1990 Unspecified osteoarthritis, unspecified site: Secondary | ICD-10-CM | POA: Diagnosis not present

## 2017-03-29 DIAGNOSIS — Z79899 Other long term (current) drug therapy: Secondary | ICD-10-CM | POA: Diagnosis not present

## 2017-03-29 DIAGNOSIS — F0390 Unspecified dementia without behavioral disturbance: Secondary | ICD-10-CM | POA: Diagnosis not present

## 2017-03-29 DIAGNOSIS — Z883 Allergy status to other anti-infective agents status: Secondary | ICD-10-CM | POA: Diagnosis not present

## 2017-03-29 DIAGNOSIS — F418 Other specified anxiety disorders: Secondary | ICD-10-CM | POA: Diagnosis not present

## 2017-03-30 DIAGNOSIS — R739 Hyperglycemia, unspecified: Secondary | ICD-10-CM | POA: Diagnosis not present

## 2017-03-30 DIAGNOSIS — F0390 Unspecified dementia without behavioral disturbance: Secondary | ICD-10-CM | POA: Diagnosis not present

## 2017-03-30 DIAGNOSIS — G9341 Metabolic encephalopathy: Secondary | ICD-10-CM | POA: Diagnosis not present

## 2017-03-30 DIAGNOSIS — N390 Urinary tract infection, site not specified: Secondary | ICD-10-CM | POA: Diagnosis not present

## 2017-03-30 DIAGNOSIS — G40409 Other generalized epilepsy and epileptic syndromes, not intractable, without status epilepticus: Secondary | ICD-10-CM | POA: Diagnosis not present

## 2017-03-31 DIAGNOSIS — F418 Other specified anxiety disorders: Secondary | ICD-10-CM | POA: Diagnosis not present

## 2017-03-31 DIAGNOSIS — Z87440 Personal history of urinary (tract) infections: Secondary | ICD-10-CM | POA: Diagnosis not present

## 2017-03-31 DIAGNOSIS — G912 (Idiopathic) normal pressure hydrocephalus: Secondary | ICD-10-CM | POA: Diagnosis not present

## 2017-03-31 DIAGNOSIS — F0390 Unspecified dementia without behavioral disturbance: Secondary | ICD-10-CM | POA: Diagnosis not present

## 2017-03-31 DIAGNOSIS — G40909 Epilepsy, unspecified, not intractable, without status epilepticus: Secondary | ICD-10-CM | POA: Diagnosis not present

## 2017-03-31 DIAGNOSIS — G9341 Metabolic encephalopathy: Secondary | ICD-10-CM | POA: Diagnosis not present

## 2017-03-31 DIAGNOSIS — S42002D Fracture of unspecified part of left clavicle, subsequent encounter for fracture with routine healing: Secondary | ICD-10-CM | POA: Diagnosis not present

## 2017-04-01 DIAGNOSIS — G40909 Epilepsy, unspecified, not intractable, without status epilepticus: Secondary | ICD-10-CM | POA: Diagnosis not present

## 2017-04-01 DIAGNOSIS — Z87440 Personal history of urinary (tract) infections: Secondary | ICD-10-CM | POA: Diagnosis not present

## 2017-04-01 DIAGNOSIS — S42002D Fracture of unspecified part of left clavicle, subsequent encounter for fracture with routine healing: Secondary | ICD-10-CM | POA: Diagnosis not present

## 2017-04-01 DIAGNOSIS — G9341 Metabolic encephalopathy: Secondary | ICD-10-CM | POA: Diagnosis not present

## 2017-04-01 DIAGNOSIS — G912 (Idiopathic) normal pressure hydrocephalus: Secondary | ICD-10-CM | POA: Diagnosis not present

## 2017-04-01 DIAGNOSIS — F0390 Unspecified dementia without behavioral disturbance: Secondary | ICD-10-CM | POA: Diagnosis not present

## 2017-04-03 DIAGNOSIS — G40909 Epilepsy, unspecified, not intractable, without status epilepticus: Secondary | ICD-10-CM | POA: Diagnosis not present

## 2017-04-03 DIAGNOSIS — G9341 Metabolic encephalopathy: Secondary | ICD-10-CM | POA: Diagnosis not present

## 2017-04-03 DIAGNOSIS — Z87440 Personal history of urinary (tract) infections: Secondary | ICD-10-CM | POA: Diagnosis not present

## 2017-04-03 DIAGNOSIS — S42002D Fracture of unspecified part of left clavicle, subsequent encounter for fracture with routine healing: Secondary | ICD-10-CM | POA: Diagnosis not present

## 2017-04-03 DIAGNOSIS — G912 (Idiopathic) normal pressure hydrocephalus: Secondary | ICD-10-CM | POA: Diagnosis not present

## 2017-04-03 DIAGNOSIS — F0390 Unspecified dementia without behavioral disturbance: Secondary | ICD-10-CM | POA: Diagnosis not present

## 2017-04-04 DIAGNOSIS — G9341 Metabolic encephalopathy: Secondary | ICD-10-CM | POA: Diagnosis not present

## 2017-04-04 DIAGNOSIS — G40909 Epilepsy, unspecified, not intractable, without status epilepticus: Secondary | ICD-10-CM | POA: Diagnosis not present

## 2017-04-04 DIAGNOSIS — Z87440 Personal history of urinary (tract) infections: Secondary | ICD-10-CM | POA: Diagnosis not present

## 2017-04-04 DIAGNOSIS — G912 (Idiopathic) normal pressure hydrocephalus: Secondary | ICD-10-CM | POA: Diagnosis not present

## 2017-04-04 DIAGNOSIS — F0390 Unspecified dementia without behavioral disturbance: Secondary | ICD-10-CM | POA: Diagnosis not present

## 2017-04-04 DIAGNOSIS — S42002D Fracture of unspecified part of left clavicle, subsequent encounter for fracture with routine healing: Secondary | ICD-10-CM | POA: Diagnosis not present

## 2017-04-05 DIAGNOSIS — F0390 Unspecified dementia without behavioral disturbance: Secondary | ICD-10-CM | POA: Diagnosis not present

## 2017-04-05 DIAGNOSIS — S42002D Fracture of unspecified part of left clavicle, subsequent encounter for fracture with routine healing: Secondary | ICD-10-CM | POA: Diagnosis not present

## 2017-04-05 DIAGNOSIS — G9341 Metabolic encephalopathy: Secondary | ICD-10-CM | POA: Diagnosis not present

## 2017-04-05 DIAGNOSIS — G912 (Idiopathic) normal pressure hydrocephalus: Secondary | ICD-10-CM | POA: Diagnosis not present

## 2017-04-05 DIAGNOSIS — G40909 Epilepsy, unspecified, not intractable, without status epilepticus: Secondary | ICD-10-CM | POA: Diagnosis not present

## 2017-04-05 DIAGNOSIS — Z87440 Personal history of urinary (tract) infections: Secondary | ICD-10-CM | POA: Diagnosis not present

## 2017-04-07 DIAGNOSIS — G912 (Idiopathic) normal pressure hydrocephalus: Secondary | ICD-10-CM | POA: Diagnosis not present

## 2017-04-07 DIAGNOSIS — Z87440 Personal history of urinary (tract) infections: Secondary | ICD-10-CM | POA: Diagnosis not present

## 2017-04-07 DIAGNOSIS — G40909 Epilepsy, unspecified, not intractable, without status epilepticus: Secondary | ICD-10-CM | POA: Diagnosis not present

## 2017-04-07 DIAGNOSIS — F0390 Unspecified dementia without behavioral disturbance: Secondary | ICD-10-CM | POA: Diagnosis not present

## 2017-04-07 DIAGNOSIS — G9341 Metabolic encephalopathy: Secondary | ICD-10-CM | POA: Diagnosis not present

## 2017-04-07 DIAGNOSIS — S42002D Fracture of unspecified part of left clavicle, subsequent encounter for fracture with routine healing: Secondary | ICD-10-CM | POA: Diagnosis not present

## 2017-04-11 DIAGNOSIS — G912 (Idiopathic) normal pressure hydrocephalus: Secondary | ICD-10-CM | POA: Diagnosis not present

## 2017-04-11 DIAGNOSIS — G9341 Metabolic encephalopathy: Secondary | ICD-10-CM | POA: Diagnosis not present

## 2017-04-11 DIAGNOSIS — F0390 Unspecified dementia without behavioral disturbance: Secondary | ICD-10-CM | POA: Diagnosis not present

## 2017-04-11 DIAGNOSIS — S42002D Fracture of unspecified part of left clavicle, subsequent encounter for fracture with routine healing: Secondary | ICD-10-CM | POA: Diagnosis not present

## 2017-04-11 DIAGNOSIS — G40909 Epilepsy, unspecified, not intractable, without status epilepticus: Secondary | ICD-10-CM | POA: Diagnosis not present

## 2017-04-11 DIAGNOSIS — Z87440 Personal history of urinary (tract) infections: Secondary | ICD-10-CM | POA: Diagnosis not present

## 2017-04-12 DIAGNOSIS — S42002D Fracture of unspecified part of left clavicle, subsequent encounter for fracture with routine healing: Secondary | ICD-10-CM | POA: Diagnosis not present

## 2017-04-12 DIAGNOSIS — G912 (Idiopathic) normal pressure hydrocephalus: Secondary | ICD-10-CM | POA: Diagnosis not present

## 2017-04-12 DIAGNOSIS — F0390 Unspecified dementia without behavioral disturbance: Secondary | ICD-10-CM | POA: Diagnosis not present

## 2017-04-12 DIAGNOSIS — G40909 Epilepsy, unspecified, not intractable, without status epilepticus: Secondary | ICD-10-CM | POA: Diagnosis not present

## 2017-04-12 DIAGNOSIS — G9341 Metabolic encephalopathy: Secondary | ICD-10-CM | POA: Diagnosis not present

## 2017-04-12 DIAGNOSIS — Z87440 Personal history of urinary (tract) infections: Secondary | ICD-10-CM | POA: Diagnosis not present

## 2017-04-13 ENCOUNTER — Telehealth: Payer: Self-pay | Admitting: Neurology

## 2017-04-13 DIAGNOSIS — Z87440 Personal history of urinary (tract) infections: Secondary | ICD-10-CM | POA: Diagnosis not present

## 2017-04-13 DIAGNOSIS — G9341 Metabolic encephalopathy: Secondary | ICD-10-CM | POA: Diagnosis not present

## 2017-04-13 DIAGNOSIS — S42002D Fracture of unspecified part of left clavicle, subsequent encounter for fracture with routine healing: Secondary | ICD-10-CM | POA: Diagnosis not present

## 2017-04-13 DIAGNOSIS — G40909 Epilepsy, unspecified, not intractable, without status epilepticus: Secondary | ICD-10-CM | POA: Diagnosis not present

## 2017-04-13 DIAGNOSIS — G912 (Idiopathic) normal pressure hydrocephalus: Secondary | ICD-10-CM | POA: Diagnosis not present

## 2017-04-13 DIAGNOSIS — F0390 Unspecified dementia without behavioral disturbance: Secondary | ICD-10-CM | POA: Diagnosis not present

## 2017-04-13 MED ORDER — LEVETIRACETAM 500 MG PO TABS: 500.0000 mg | ORAL_TABLET | Freq: Two times a day (BID) | ORAL | 2 refills | Status: DC

## 2017-04-13 NOTE — Telephone Encounter (Signed)
Pt son has called re: seizure pt had on 02-06, son states pt was given 1 month prescription for Keppra, [facility] [place] - [address] [phone] (Phone) [phone] (Fax)    pt will soon run out and he wants to know if Dr Lucia GaskinsAhern will refill before next appointment.  Son also wants pt to be seen before 04-01, pt on wait list but he would like to be called if she can be seen before, hospital also mentioned pt may have hydrocephalus.  Please call

## 2017-04-13 NOTE — Telephone Encounter (Signed)
Spoke with pt's son Leonette [place] ([place] Patient went to [facility] ED on 03/29/17 for a seizure. Patient started Keppra 500 mg BID on 03/29/17 with a one month supply, prescribed by ED. He states she is doing well with it other than a little irritable. Would like a refill to get through appointment and would like sooner appt if possible. Lillia Carmelold Charles will discuss with Dr. Lucia GaskinsAhern.

## 2017-04-13 NOTE — Telephone Encounter (Signed)
Per Dr. Lucia GaskinsAhern, ok to refill Keppra. E-prescribed Keppra IR 500 mg BID.

## 2017-04-13 NOTE — Addendum Note (Signed)
Addended by: Bertram SavinULBERTSON, Aelyn Stanaland L on: 04/13/2017 12:10 PM   Modules accepted: Orders

## 2017-04-13 NOTE — Telephone Encounter (Signed)
Dr. Lucia GaskinsAhern is able to see pt sooner. R/s pt with son on phone for Thursday 04/20/17 @ 11:15 arrival time 10:45. He verbalized appreciation and is aware that Keppra was refilled.

## 2017-04-17 ENCOUNTER — Ambulatory Visit: Payer: Medicare Other | Admitting: Neurology

## 2017-04-17 DIAGNOSIS — Z87440 Personal history of urinary (tract) infections: Secondary | ICD-10-CM | POA: Diagnosis not present

## 2017-04-17 DIAGNOSIS — G9341 Metabolic encephalopathy: Secondary | ICD-10-CM | POA: Diagnosis not present

## 2017-04-17 DIAGNOSIS — G912 (Idiopathic) normal pressure hydrocephalus: Secondary | ICD-10-CM | POA: Diagnosis not present

## 2017-04-17 DIAGNOSIS — S42002D Fracture of unspecified part of left clavicle, subsequent encounter for fracture with routine healing: Secondary | ICD-10-CM | POA: Diagnosis not present

## 2017-04-17 DIAGNOSIS — G40909 Epilepsy, unspecified, not intractable, without status epilepticus: Secondary | ICD-10-CM | POA: Diagnosis not present

## 2017-04-17 DIAGNOSIS — F0390 Unspecified dementia without behavioral disturbance: Secondary | ICD-10-CM | POA: Diagnosis not present

## 2017-04-19 DIAGNOSIS — G912 (Idiopathic) normal pressure hydrocephalus: Secondary | ICD-10-CM | POA: Diagnosis not present

## 2017-04-19 DIAGNOSIS — G9341 Metabolic encephalopathy: Secondary | ICD-10-CM | POA: Diagnosis not present

## 2017-04-19 DIAGNOSIS — Z87440 Personal history of urinary (tract) infections: Secondary | ICD-10-CM | POA: Diagnosis not present

## 2017-04-19 DIAGNOSIS — S42002D Fracture of unspecified part of left clavicle, subsequent encounter for fracture with routine healing: Secondary | ICD-10-CM | POA: Diagnosis not present

## 2017-04-19 DIAGNOSIS — F0390 Unspecified dementia without behavioral disturbance: Secondary | ICD-10-CM | POA: Diagnosis not present

## 2017-04-19 DIAGNOSIS — G40909 Epilepsy, unspecified, not intractable, without status epilepticus: Secondary | ICD-10-CM | POA: Diagnosis not present

## 2017-04-20 ENCOUNTER — Encounter: Payer: Self-pay | Admitting: Neurology

## 2017-04-20 ENCOUNTER — Ambulatory Visit (INDEPENDENT_AMBULATORY_CARE_PROVIDER_SITE_OTHER): Payer: Medicare Other | Admitting: Neurology

## 2017-04-20 VITALS — BP 100/72 | HR 85 | Ht 62.0 in | Wt 106.0 lb

## 2017-04-20 DIAGNOSIS — G912 (Idiopathic) normal pressure hydrocephalus: Secondary | ICD-10-CM

## 2017-04-20 DIAGNOSIS — R569 Unspecified convulsions: Secondary | ICD-10-CM | POA: Diagnosis not present

## 2017-04-20 MED ORDER — LEVETIRACETAM 500 MG PO TABS: 500.0000 mg | ORAL_TABLET | Freq: Every day | ORAL | 2 refills | Status: AC

## 2017-04-20 NOTE — Patient Instructions (Signed)
Decrease Keppra to once daily for one week then stop.   Seizure, Adult When you have a seizure:  Parts of your body may move.  How aware or awake (conscious) you are may change.  You may shake (convulse).  Some people have symptoms right before a seizure happens. These symptoms may include:  Fear.  Worry (anxiety).  Feeling like you are going to throw up (nausea).  Feeling like the room is spinning (vertigo).  Feeling like you saw or heard something before (deja vu).  Odd tastes or smells.  Changes in vision, such as seeing flashing lights or spots.  Seizures usually last from 30 seconds to 2 minutes. Usually, they are not harmful unless they last a long time. Follow these instructions at home: Medicines  Take over-the-counter and prescription medicines only as told by your doctor.  Avoid anything that may keep your medicine from working, such as alcohol. Activity  Do not do any activities that would be dangerous if you had another seizure, like driving or swimming. Wait until your doctor approves.  If you live in the [place] ask your local DMV (department of motor vehicles) when you can drive.  Rest. Teaching others  Teach friends and family what to do when you have a seizure. They should: ? Lay you on the ground. ? Protect your head and body. ? Loosen any tight clothing around your neck. ? Turn you on your side. ? Stay with you until you are better. ? Not hold you down. ? Not put anything in your mouth. ? Know whether or not you need emergency care. General instructions  Contact your doctor each time you have a seizure.  Avoid anything that gives you seizures.  Keep a seizure diary. Write down: ? What you think caused each seizure. ? What you remember about each seizure.  Keep all follow-up visits as told by your doctor. This is important. Contact a doctor if:  You have another seizure.  You have seizures more often.  There is any change in what  happens during your seizures.  You continue to have seizures with treatment.  You have symptoms of being sick or having an infection. Get help right away if:  You have a seizure: ? That lasts longer than 5 minutes. ? That is different than seizures you had before. ? That makes it harder to breathe. ? After you hurt your head.  After a seizure, you cannot speak or use a part of your body.  After a seizure, you are confused or have a bad headache.  You have two or more seizures in a row.  You are having seizures more often.  You do not wake up right after a seizure.  You get hurt during a seizure. In an emergency:  These symptoms may be an emergency. Do not wait to see if the symptoms will go away. Get medical help right away. Call your local emergency services (911 in the [place] Do not drive yourself to the hospital. This information is not intended to replace advice given to you by your health care provider. Make sure you discuss any questions you have with your health care provider. Document Released: 07/27/2007 Document Revised: 10/21/2015 Document Reviewed: 10/21/2015 Elsevier Interactive Patient Education  2017 [employer]

## 2017-04-20 NOTE — Progress Notes (Addendum)
[facility]    Provider:  Dr Jaynee Eagles Referring Provider: Manon Hilding, MD Primary Care Physician:  Manon Hilding, MD   Interval history 04/20/2017:  Patient is here for a new problem seizures. She is irritable on the Keppra. Here with husband who provides much information. Here with son and daughter in law. They want a referral to neurosurgery for referral of NPH. She did not benefit from high-volume tap but given symptoms family and Dr. Quintin Alto want her evaluated for shunt. Will refer. She has a new onset seizure. She doesn't remember the seizure. She was sitting in the chair and eyes rolled back, lost consciousness possibly or closed eyes. She is irritable on the Keppra.  Reviewed CT head (all images in PACS) appears stable as far as ventriculomegaly.   Interval history 01/03/2017: Patient with recurrent falls, dementia, admitted to SNF for therapy in May and nursing care, stays very confused and continues to be be at high risk for falls. On Aricept. Namenda discontinued. In May her wbcs elevated. She was discharged back home from SNF in May with home therapy.  No falls since last being seen. She is home with family. Sons here. She is "honery" but no severe irritation or mood disorder. Patient says her memory is fine. Sons think the memory loss is progressive. She walks with a walker.    Interval history 07/07/2016: Patient returns today after high volume LP to evaluate for normal pressure hydrocephalus. No improvement in gait or cognitive abilities. She fell last Wednesday and broke her wrist. She was climbing the steps and fell on the floor. This was 8am. Discussed a home safety inspection. They are trying to keep her on the main floor and they have help a few times a week.   MEQ:ASTM Pruittis a 77 y.o.femalehere as a referral from Dr. Koleen Distance dementia. MRI of the brain showed marketed ventriculomegaly. She is on Aricept. She is a current every day smoker. Past medical  history of anxiety and depression.Patient is here for son and daughter. Patient says she can't remember things doesn't know when it started. She can't give anymore details. Here with a friend and her son. Her gait is impaired she has difficulty going up steps. She tells stories of things that did not happen. She gets people mixed up, she thought her primary doctor was a Environmental education officer. She often mistakes her son for her brother. She has periods of agitation, being mean. Symptoms started a year ago, progressive. She went to the hospital with a UTI 2 months ago and since then worse. She wsa very agitated in the hospital and needed sedation or a 24x7 sitter. They started Geodon in the hospital before she had the MRI. She can sign her name to a check but can't manage finances. She lives in a home. Son is there all day long. She has difficulty using the microwave and seeing the numbers. She has delusions, she thinks someone else is living with them. She obsesses over the dog. She has difficulty with the date, repeating the same stories or questions in 5 minutes. She thinks she has to perform tasks   Reviewed notes, labs and imaging from outside physicians, which showed:  Reviewed MRI report and also images brought on CD by family and agree with the following. No acute stroke, acute hemorrhage, mass lesion, or extra-axial fluid. Marked ventriculomegaly, particularly affecting the lateral and third ventricles. His disproportionate lack of enlargement of the cortical sulci. Normal pressure hydrocephalus is not excluded. In  addition to the findings which are consistent with chronic microvascular ischemic change, there is fairly continuous T2 FLAIR hyperintensity surrounding the ventricles which could represent transependymal absorption. Partially empty sella. Impression marked ventriculomegaly could represent normal pressure hydrocephalus. Chronic microvascular ischemic changes may be superimposed. There is a possibility  of cortical atrophy. Consider lumbar puncture for further evaluation.  CMP 03/01/2016 with BUN 12 and creatinine 1.07, EGFR slightly reduced. CBC was unremarkable. TSH 3.2. RPR nonreactive. B12 and folate normal, B12 686.  [facility]. It appears as though she was admitted for Escherichia coli UTI,   Review of Systems: Patient complains of symptoms per HPI as well as the following symptoms: memory changes, seizure. Pertinent negatives per HPI. All others negative  Social History   Socioeconomic History  . Marital status: Divorced    Spouse name: Not on file  . Number of children: 2  . Years of education: Nursing school  . Highest education level: Not on file  Social Needs  . Financial resource strain: Not on file  . Food insecurity - worry: Not on file  . Food insecurity - inability: Not on file  . Transportation needs - medical: Not on file  . Transportation needs - non-medical: Not on file  Occupational History  . Occupation: Retired  Tobacco Use  . Smoking status: Current Every Day Smoker    Packs/day: 0.75    Types: Cigarettes  . Smokeless tobacco: Never Used  . Tobacco comment: Occasional  Substance and Sexual Activity  . Alcohol use: No  . Drug use: No  . Sexual activity: Not on file  Other Topics Concern  . Not on file  Social History Narrative   Lives w/ her son   Right-handed   Caffeine: none    Family History  Problem Relation Age of Onset  . Breast cancer Mother   . Stroke Father   . Dementia Neg Hx     Past Medical History:  Diagnosis Date  . Clavicle fracture   . Fall   . Memory loss     Past Surgical History:  Procedure Laterality Date  . TUBAL LIGATION    . WRIST FRACTURE SURGERY Left     Current Outpatient Medications  Medication Sig Dispense Refill  . donepezil (ARICEPT) 10 MG tablet Take 1 tablet (10 mg total) daily by mouth. 30 tablet 11  . levETIRAcetam (KEPPRA) 500 MG tablet Take 1 tablet (500  mg total) by mouth daily. 60 tablet 2  . memantine (NAMENDA) 10 MG tablet Take 1 tablet (10 mg total) 2 (two) times daily by mouth. 60 tablet 12  . polyethylene glycol (MIRALAX / GLYCOLAX) packet Take 17 g by mouth daily as needed.      No current facility-administered medications for this visit.     Allergies as of 04/20/2017  . (No Known Allergies)    Vitals: BP 100/72 (BP Location: Left Arm, Patient Position: Sitting)   Pulse 85   Ht _0  (1.575 m)   Wt 106 lb (48.1 kg)   BMI 19.39 kg/m  Last Weight:  Wt Readings from Last 1 Encounters:  04/20/17 106 lb (48.1 kg)   Last Height:   Ht Readings from Last 1 Encounters:  04/20/17 _1  (1.575 m)   MMSE - Mini Mental State Exam 07/07/2016 06/21/2016  Orientation to time 0 3  Orientation to Place 2 2  Registration 3 3  Attention/ Calculation 5 4  Recall 0 0  Language- name 2  objects 1 2  Language- repeat 1 1  Language- follow 3 step command 0 2  Language- read & follow direction 0 0  Write a sentence 0 0  Copy design 1 0  Total score 13 17    The patient is oriented to person recent and remote memory impaired;  language fluent;  Impaired attention, concentration, fund of knowledge Cranial Nerves: The pupils are equal, round, and reactive to light. Attempted funduscopic exam could not visualize due to patient cooperation.. Visual fields are full to finger confrontation. Exotropia Extraocular movements are intact. Trigeminal sensation is intact and the muscles of mastication are normal. The face is symmetric. The palate elevates in the midline. Hearing intact. Voice is normal. Shoulder shrug is normal. The tongue has normal motion without fasciculations.   Coordination: Bradykinetic, no dymetria noted but limited exam because she had difficulty understanding the exam   Gait: Magnetic gait with stooped posture  Motor Observation: no involuntary movements noted. Tone: Cogwheelig in the  uppers  Posture: Posture is mildly stooped  Strength: antigravity and equal, attempted could not perform full motor exam due to patient's inability to comprehend the instructions.   Sensation: intact to LT  Reflex Exam:  DTR's: Deep tendon reflexes in the upper and lower extremities are brisk bilaterally.  Toes: The toes are downgoing bilaterally.  Clonus: Clonus is absent.   Assessment/Plan:77 year old dementia and marked ventriculomegaly suspicious for NPH, today she is here for new-onset seizure. Her gait is magnetic. Mini-Mental status exam 13  out of 30.  Unfortunately there was no improvement after high volume lumbar puncture; no improvement in cognition or in gait. Family reports that she is actually worsened since a fall. But given new onset seizure, her examination that can be consistent with NPH, feel neurosurgery evaluation appropriate to discuss possible shunting.   - Continue Aricept and Namenda - For her seizure continue Keppra, will request records from hospitalization in [place]. However they want her off of the keppra, discussed risks of seizure as we switch and risk of new side effects.They decline any new seizure medications and understand the risks. Slowly titrate keppra off.  - EEG and MRI brain for seizures and follow possible NPH - MRI findings show hydrocephalus but feel it is commensurate with atrophy. Will reorder MRI brain. High volume tap did not show improvement. Family would like a second opinion from [place], will refer.  Orders Placed This Encounter  Procedures  . MR BRAIN W WO CONTRAST  . Basic Metabolic Panel  . Ambulatory referral to Neurosurgery  . EEG     - I would minimize use of geodon and amitriptyline, if possible,in this 77 year old patient with memory disorder. Both of these medications can cause cognitive changes especially in the elderly. Will defer to PCP. However often times neuroleptics are used in the elderly  but advised that can be significant side effects, they are not FDA approved for use in the elderly, and there is a black box warning for significant morbidity or mortality in the elderly. But often times benefits can outweigh the risks of atypical antipsychotics.   Cc: Dr. Quintin Alto  Mri head from [facility] (see scanned) 04/2016 showed marked ventriculomegaly, microvascular ischemia  Sarina Ill, MD  [facility] [address] [place], [place] [address]  Phone [phone] Fax [phone]  A total of 25 minutes was spent face-to-face with this patient. Over half this time was spent on counseling patient on the seizure, NPH diagnosis and different diagnostic and therapeutic options  available.

## 2017-04-21 ENCOUNTER — Telehealth: Payer: Self-pay | Admitting: *Deleted

## 2017-04-21 LAB — BASIC METABOLIC PANEL
BUN/Creatinine Ratio: 16 (ref 12–28)
BUN: 17 mg/dL (ref 8–27)
CALCIUM: 9.8 mg/dL (ref 8.7–10.3)
CO2: 23 mmol/L (ref 20–29)
CREATININE: 1.05 mg/dL — AB (ref 0.57–1.00)
Chloride: 105 mmol/L (ref 96–106)
GFR, EST AFRICAN AMERICAN: 59 mL/min/{1.73_m2} — AB (ref >59)
GFR, EST NON AFRICAN AMERICAN: 51 mL/min/{1.73_m2} — AB (ref >59)
Glucose: 121 mg/dL — ABNORMAL HIGH (ref 65–99)
POTASSIUM: 4.4 mmol/L (ref 3.5–5.2)
SODIUM: 143 mmol/L (ref 134–144)

## 2017-04-21 NOTE — Telephone Encounter (Signed)
Spoke with pt's son Leonette MostCharles. He is aware that labs are stable. Her MRI [facility] is set [facility], [facility]. Informed Joanie CoddingtonCharles RN would send this request to MRI scheduling to see if this is a possibility. He verbalized appreciation.

## 2017-04-21 NOTE — Telephone Encounter (Signed)
-----   Message from Anson FretAntonia B Ahern, MD sent at 04/21/2017 10:39 AM EST ----- Labs stable

## 2017-04-24 DIAGNOSIS — S42002D Fracture of unspecified part of left clavicle, subsequent encounter for fracture with routine healing: Secondary | ICD-10-CM | POA: Diagnosis not present

## 2017-04-24 DIAGNOSIS — G40909 Epilepsy, unspecified, not intractable, without status epilepticus: Secondary | ICD-10-CM | POA: Diagnosis not present

## 2017-04-24 DIAGNOSIS — G9341 Metabolic encephalopathy: Secondary | ICD-10-CM | POA: Diagnosis not present

## 2017-04-24 DIAGNOSIS — F0390 Unspecified dementia without behavioral disturbance: Secondary | ICD-10-CM | POA: Diagnosis not present

## 2017-04-24 DIAGNOSIS — G912 (Idiopathic) normal pressure hydrocephalus: Secondary | ICD-10-CM | POA: Diagnosis not present

## 2017-04-24 DIAGNOSIS — Z87440 Personal history of urinary (tract) infections: Secondary | ICD-10-CM | POA: Diagnosis not present

## 2017-04-26 DIAGNOSIS — S42002D Fracture of unspecified part of left clavicle, subsequent encounter for fracture with routine healing: Secondary | ICD-10-CM | POA: Diagnosis not present

## 2017-04-26 DIAGNOSIS — G912 (Idiopathic) normal pressure hydrocephalus: Secondary | ICD-10-CM | POA: Diagnosis not present

## 2017-04-26 DIAGNOSIS — F0390 Unspecified dementia without behavioral disturbance: Secondary | ICD-10-CM | POA: Diagnosis not present

## 2017-04-26 DIAGNOSIS — Z87440 Personal history of urinary (tract) infections: Secondary | ICD-10-CM | POA: Diagnosis not present

## 2017-04-26 DIAGNOSIS — G40909 Epilepsy, unspecified, not intractable, without status epilepticus: Secondary | ICD-10-CM | POA: Diagnosis not present

## 2017-04-26 DIAGNOSIS — G9341 Metabolic encephalopathy: Secondary | ICD-10-CM | POA: Diagnosis not present

## 2017-04-27 ENCOUNTER — Encounter (HOSPITAL_COMMUNITY): Payer: Self-pay | Admitting: Radiology

## 2017-04-27 ENCOUNTER — Ambulatory Visit (HOSPITAL_COMMUNITY)
Admission: RE | Admit: 2017-04-27 | Discharge: 2017-04-27 | Disposition: A | Discharge status: Home / Self Care | Payer: Medicare Other | Source: Ambulatory Visit | Attending: Neurology | Admitting: Neurology

## 2017-04-27 DIAGNOSIS — G319 Degenerative disease of nervous system, unspecified: Secondary | ICD-10-CM | POA: Insufficient documentation

## 2017-04-27 DIAGNOSIS — G912 (Idiopathic) normal pressure hydrocephalus: Secondary | ICD-10-CM | POA: Insufficient documentation

## 2017-04-27 DIAGNOSIS — G9389 Other specified disorders of brain: Secondary | ICD-10-CM | POA: Diagnosis not present

## 2017-04-27 DIAGNOSIS — R4182 Altered mental status, unspecified: Secondary | ICD-10-CM | POA: Diagnosis not present

## 2017-04-27 DIAGNOSIS — I6782 Cerebral ischemia: Secondary | ICD-10-CM | POA: Diagnosis not present

## 2017-04-27 DIAGNOSIS — R569 Unspecified convulsions: Secondary | ICD-10-CM | POA: Insufficient documentation

## 2017-04-27 MED ORDER — GADOBENATE DIMEGLUMINE 529 MG/ML IV SOLN
10.0000 mL | Freq: Once | INTRAVENOUS | Status: AC | PRN
  Administered 2017-04-27: 9 mL via INTRAVENOUS

## 2017-04-29 DIAGNOSIS — F411 Generalized anxiety disorder: Secondary | ICD-10-CM | POA: Diagnosis not present

## 2017-04-29 DIAGNOSIS — F0281 Dementia in other diseases classified elsewhere with behavioral disturbance: Secondary | ICD-10-CM | POA: Diagnosis not present

## 2017-04-29 DIAGNOSIS — G912 (Idiopathic) normal pressure hydrocephalus: Secondary | ICD-10-CM | POA: Diagnosis not present

## 2017-04-29 DIAGNOSIS — R569 Unspecified convulsions: Secondary | ICD-10-CM | POA: Diagnosis not present

## 2017-05-01 DIAGNOSIS — S42002D Fracture of unspecified part of left clavicle, subsequent encounter for fracture with routine healing: Secondary | ICD-10-CM | POA: Diagnosis not present

## 2017-05-01 DIAGNOSIS — Z87440 Personal history of urinary (tract) infections: Secondary | ICD-10-CM | POA: Diagnosis not present

## 2017-05-01 DIAGNOSIS — G912 (Idiopathic) normal pressure hydrocephalus: Secondary | ICD-10-CM | POA: Diagnosis not present

## 2017-05-01 DIAGNOSIS — F0390 Unspecified dementia without behavioral disturbance: Secondary | ICD-10-CM | POA: Diagnosis not present

## 2017-05-01 DIAGNOSIS — G40909 Epilepsy, unspecified, not intractable, without status epilepticus: Secondary | ICD-10-CM | POA: Diagnosis not present

## 2017-05-01 DIAGNOSIS — G9341 Metabolic encephalopathy: Secondary | ICD-10-CM | POA: Diagnosis not present

## 2017-05-02 ENCOUNTER — Telehealth: Payer: Self-pay | Admitting: *Deleted

## 2017-05-02 NOTE — Telephone Encounter (Addendum)
Spoke with Debbie Hawkins and discussed that MRI of the brain is unchanged from last imaging, no enlargement of the ventricles, neuroradiology does not think this is NPH. He verbalized understanding and questions were answered. He is aware that pt has an EEG on 3/14 @ 12:45. He had no further questions.   ----- Message from Anson FretAntonia B Ahern, MD sent at 05/01/2017  6:43 PM EDT ----- MRI of the brain is unchanged from last imaging, no enlargement of the ventricles, neuroradiology does not think this is NPH thanks

## 2017-05-03 DIAGNOSIS — Z87440 Personal history of urinary (tract) infections: Secondary | ICD-10-CM | POA: Diagnosis not present

## 2017-05-03 DIAGNOSIS — F0390 Unspecified dementia without behavioral disturbance: Secondary | ICD-10-CM | POA: Diagnosis not present

## 2017-05-03 DIAGNOSIS — S42002D Fracture of unspecified part of left clavicle, subsequent encounter for fracture with routine healing: Secondary | ICD-10-CM | POA: Diagnosis not present

## 2017-05-03 DIAGNOSIS — G912 (Idiopathic) normal pressure hydrocephalus: Secondary | ICD-10-CM | POA: Diagnosis not present

## 2017-05-03 DIAGNOSIS — G9341 Metabolic encephalopathy: Secondary | ICD-10-CM | POA: Diagnosis not present

## 2017-05-03 DIAGNOSIS — G40909 Epilepsy, unspecified, not intractable, without status epilepticus: Secondary | ICD-10-CM | POA: Diagnosis not present

## 2017-05-04 ENCOUNTER — Ambulatory Visit (INDEPENDENT_AMBULATORY_CARE_PROVIDER_SITE_OTHER): Payer: Medicare Other | Admitting: Neurology

## 2017-05-04 DIAGNOSIS — R569 Unspecified convulsions: Secondary | ICD-10-CM

## 2017-05-04 DIAGNOSIS — G912 (Idiopathic) normal pressure hydrocephalus: Secondary | ICD-10-CM

## 2017-05-04 NOTE — Procedures (Signed)
    History:  Debbie ForestLucy Hopson is a 77 year old patient with a history of a dementia, she is being evaluated for possible seizures.  She is on Keppra but she is having irritability on this.  The patient apparently was sitting in a chair and her eyes rolled back and she lost consciousness.  She is being evaluated for this episode.  This is a routine EEG.  No skull defects are noted.  Medications include Aricept, Keppra, Namenda, and MiraLAX.  EEG classification: Normal awake  Description of the recording: The background rhythms of this recording consists of a fairly well modulated medium amplitude alpha rhythm of 8 Hz that is reactive to eye opening and closure. As the record progresses, the patient appears to remain in the waking state throughout the recording. Photic stimulation was performed, resulting in a bilateral and symmetric photic driving response. Hyperventilation was also performed, resulting in a minimal buildup of the background rhythm activities without significant slowing seen. At no time during the recording does there appear to be evidence of spike or spike wave discharges or evidence of focal slowing. EKG monitor shows no evidence of cardiac rhythm abnormalities with a heart rate of 72.  Impression: This is a normal EEG recording in the waking state. No evidence of ictal or interictal discharges are seen.

## 2017-05-05 ENCOUNTER — Telehealth: Payer: Self-pay | Admitting: *Deleted

## 2017-05-05 DIAGNOSIS — S42002D Fracture of unspecified part of left clavicle, subsequent encounter for fracture with routine healing: Secondary | ICD-10-CM | POA: Diagnosis not present

## 2017-05-05 DIAGNOSIS — F0390 Unspecified dementia without behavioral disturbance: Secondary | ICD-10-CM | POA: Diagnosis not present

## 2017-05-05 DIAGNOSIS — G9341 Metabolic encephalopathy: Secondary | ICD-10-CM | POA: Diagnosis not present

## 2017-05-05 DIAGNOSIS — G40909 Epilepsy, unspecified, not intractable, without status epilepticus: Secondary | ICD-10-CM | POA: Diagnosis not present

## 2017-05-05 DIAGNOSIS — G912 (Idiopathic) normal pressure hydrocephalus: Secondary | ICD-10-CM | POA: Diagnosis not present

## 2017-05-05 DIAGNOSIS — Z87440 Personal history of urinary (tract) infections: Secondary | ICD-10-CM | POA: Diagnosis not present

## 2017-05-05 NOTE — Telephone Encounter (Signed)
-----   Message from Anson FretAntonia B Ahern, MD sent at 05/04/2017 10:23 PM EDT ----- EEG is normal

## 2017-05-05 NOTE — Telephone Encounter (Signed)
Called and spoke with pt's son [place] and informed him that the patient's EEG was normal. He verbalized understanding and appreciation.

## 2017-05-09 DIAGNOSIS — G912 (Idiopathic) normal pressure hydrocephalus: Secondary | ICD-10-CM | POA: Diagnosis not present

## 2017-05-09 DIAGNOSIS — G40909 Epilepsy, unspecified, not intractable, without status epilepticus: Secondary | ICD-10-CM | POA: Diagnosis not present

## 2017-05-09 DIAGNOSIS — F0390 Unspecified dementia without behavioral disturbance: Secondary | ICD-10-CM | POA: Diagnosis not present

## 2017-05-09 DIAGNOSIS — Z87440 Personal history of urinary (tract) infections: Secondary | ICD-10-CM | POA: Diagnosis not present

## 2017-05-09 DIAGNOSIS — S42002D Fracture of unspecified part of left clavicle, subsequent encounter for fracture with routine healing: Secondary | ICD-10-CM | POA: Diagnosis not present

## 2017-05-09 DIAGNOSIS — G9341 Metabolic encephalopathy: Secondary | ICD-10-CM | POA: Diagnosis not present

## 2017-05-19 DIAGNOSIS — S42002D Fracture of unspecified part of left clavicle, subsequent encounter for fracture with routine healing: Secondary | ICD-10-CM | POA: Diagnosis not present

## 2017-05-19 DIAGNOSIS — G912 (Idiopathic) normal pressure hydrocephalus: Secondary | ICD-10-CM | POA: Diagnosis not present

## 2017-05-19 DIAGNOSIS — G9341 Metabolic encephalopathy: Secondary | ICD-10-CM | POA: Diagnosis not present

## 2017-05-19 DIAGNOSIS — Z87440 Personal history of urinary (tract) infections: Secondary | ICD-10-CM | POA: Diagnosis not present

## 2017-05-19 DIAGNOSIS — F0390 Unspecified dementia without behavioral disturbance: Secondary | ICD-10-CM | POA: Diagnosis not present

## 2017-05-19 DIAGNOSIS — G40909 Epilepsy, unspecified, not intractable, without status epilepticus: Secondary | ICD-10-CM | POA: Diagnosis not present

## 2017-05-22 ENCOUNTER — Ambulatory Visit: Payer: Medicare Other | Admitting: Neurology

## 2017-05-25 DIAGNOSIS — G40909 Epilepsy, unspecified, not intractable, without status epilepticus: Secondary | ICD-10-CM | POA: Diagnosis not present

## 2017-05-25 DIAGNOSIS — Z87440 Personal history of urinary (tract) infections: Secondary | ICD-10-CM | POA: Diagnosis not present

## 2017-05-25 DIAGNOSIS — F0390 Unspecified dementia without behavioral disturbance: Secondary | ICD-10-CM | POA: Diagnosis not present

## 2017-05-25 DIAGNOSIS — G9341 Metabolic encephalopathy: Secondary | ICD-10-CM | POA: Diagnosis not present

## 2017-05-25 DIAGNOSIS — S42002D Fracture of unspecified part of left clavicle, subsequent encounter for fracture with routine healing: Secondary | ICD-10-CM | POA: Diagnosis not present

## 2017-05-25 DIAGNOSIS — G912 (Idiopathic) normal pressure hydrocephalus: Secondary | ICD-10-CM | POA: Diagnosis not present

## 2017-06-02 DIAGNOSIS — G911 Obstructive hydrocephalus: Secondary | ICD-10-CM | POA: Diagnosis not present

## 2017-06-05 ENCOUNTER — Institutional Professional Consult (permissible substitution): Payer: Medicare Other | Admitting: Neurology

## 2017-07-13 DIAGNOSIS — M719 Bursopathy, unspecified: Secondary | ICD-10-CM | POA: Diagnosis not present

## 2017-07-13 DIAGNOSIS — M67919 Unspecified disorder of synovium and tendon, unspecified shoulder: Secondary | ICD-10-CM | POA: Diagnosis not present

## 2017-07-13 DIAGNOSIS — M25511 Pain in right shoulder: Secondary | ICD-10-CM | POA: Diagnosis not present

## 2017-07-13 DIAGNOSIS — G8929 Other chronic pain: Secondary | ICD-10-CM | POA: Diagnosis not present

## 2017-08-20 DIAGNOSIS — Z883 Allergy status to other anti-infective agents status: Secondary | ICD-10-CM | POA: Diagnosis not present

## 2017-08-20 DIAGNOSIS — Z886 Allergy status to analgesic agent status: Secondary | ICD-10-CM | POA: Diagnosis not present

## 2017-08-20 DIAGNOSIS — F419 Anxiety disorder, unspecified: Secondary | ICD-10-CM | POA: Diagnosis not present

## 2017-08-20 DIAGNOSIS — M6281 Muscle weakness (generalized): Secondary | ICD-10-CM | POA: Diagnosis not present

## 2017-08-20 DIAGNOSIS — M1990 Unspecified osteoarthritis, unspecified site: Secondary | ICD-10-CM | POA: Diagnosis not present

## 2017-08-20 DIAGNOSIS — R531 Weakness: Secondary | ICD-10-CM | POA: Diagnosis not present

## 2017-08-20 DIAGNOSIS — Z79899 Other long term (current) drug therapy: Secondary | ICD-10-CM | POA: Diagnosis not present

## 2017-08-20 DIAGNOSIS — T678XXA Other effects of heat and light, initial encounter: Secondary | ICD-10-CM | POA: Diagnosis not present

## 2017-08-20 DIAGNOSIS — F0390 Unspecified dementia without behavioral disturbance: Secondary | ICD-10-CM | POA: Diagnosis not present

## 2017-08-20 DIAGNOSIS — R42 Dizziness and giddiness: Secondary | ICD-10-CM | POA: Diagnosis not present

## 2017-08-20 DIAGNOSIS — F17200 Nicotine dependence, unspecified, uncomplicated: Secondary | ICD-10-CM | POA: Diagnosis not present

## 2017-08-20 DIAGNOSIS — F329 Major depressive disorder, single episode, unspecified: Secondary | ICD-10-CM | POA: Diagnosis not present

## 2017-08-20 DIAGNOSIS — R262 Difficulty in walking, not elsewhere classified: Secondary | ICD-10-CM | POA: Diagnosis not present

## 2017-08-20 DIAGNOSIS — R61 Generalized hyperhidrosis: Secondary | ICD-10-CM | POA: Diagnosis not present

## 2017-08-20 DIAGNOSIS — I951 Orthostatic hypotension: Secondary | ICD-10-CM | POA: Diagnosis not present

## 2017-08-21 DIAGNOSIS — R531 Weakness: Secondary | ICD-10-CM | POA: Diagnosis not present

## 2017-08-21 DIAGNOSIS — Z886 Allergy status to analgesic agent status: Secondary | ICD-10-CM | POA: Diagnosis not present

## 2017-08-21 DIAGNOSIS — X30XXXA Exposure to excessive natural heat, initial encounter: Secondary | ICD-10-CM | POA: Diagnosis not present

## 2017-08-21 DIAGNOSIS — F419 Anxiety disorder, unspecified: Secondary | ICD-10-CM | POA: Diagnosis not present

## 2017-08-21 DIAGNOSIS — M1990 Unspecified osteoarthritis, unspecified site: Secondary | ICD-10-CM | POA: Diagnosis not present

## 2017-08-21 DIAGNOSIS — I951 Orthostatic hypotension: Secondary | ICD-10-CM | POA: Diagnosis not present

## 2017-08-21 DIAGNOSIS — F0390 Unspecified dementia without behavioral disturbance: Secondary | ICD-10-CM | POA: Diagnosis not present

## 2017-08-21 DIAGNOSIS — Z79899 Other long term (current) drug therapy: Secondary | ICD-10-CM | POA: Diagnosis not present

## 2017-08-21 DIAGNOSIS — F17200 Nicotine dependence, unspecified, uncomplicated: Secondary | ICD-10-CM | POA: Diagnosis not present

## 2017-08-21 DIAGNOSIS — R42 Dizziness and giddiness: Secondary | ICD-10-CM | POA: Diagnosis not present

## 2017-08-21 DIAGNOSIS — Z883 Allergy status to other anti-infective agents status: Secondary | ICD-10-CM | POA: Diagnosis not present

## 2017-08-21 DIAGNOSIS — R262 Difficulty in walking, not elsewhere classified: Secondary | ICD-10-CM | POA: Diagnosis not present

## 2017-08-21 DIAGNOSIS — F329 Major depressive disorder, single episode, unspecified: Secondary | ICD-10-CM | POA: Diagnosis not present

## 2017-08-21 DIAGNOSIS — T678XXA Other effects of heat and light, initial encounter: Secondary | ICD-10-CM | POA: Diagnosis not present

## 2017-08-21 DIAGNOSIS — M6281 Muscle weakness (generalized): Secondary | ICD-10-CM | POA: Diagnosis not present

## 2017-09-20 DIAGNOSIS — G301 Alzheimer's disease with late onset: Secondary | ICD-10-CM | POA: Diagnosis not present

## 2017-09-20 DIAGNOSIS — F411 Generalized anxiety disorder: Secondary | ICD-10-CM | POA: Diagnosis not present

## 2017-09-20 DIAGNOSIS — Z720 Tobacco use: Secondary | ICD-10-CM | POA: Diagnosis not present

## 2017-09-20 DIAGNOSIS — K5901 Slow transit constipation: Secondary | ICD-10-CM | POA: Diagnosis not present

## 2017-09-20 DIAGNOSIS — Z6820 Body mass index (BMI) 20.0-20.9, adult: Secondary | ICD-10-CM | POA: Diagnosis not present

## 2017-12-27 DIAGNOSIS — G301 Alzheimer's disease with late onset: Secondary | ICD-10-CM | POA: Diagnosis not present

## 2017-12-27 DIAGNOSIS — F411 Generalized anxiety disorder: Secondary | ICD-10-CM | POA: Diagnosis not present

## 2017-12-27 DIAGNOSIS — Z23 Encounter for immunization: Secondary | ICD-10-CM | POA: Diagnosis not present

## 2018-01-08 ENCOUNTER — Other Ambulatory Visit: Payer: Self-pay

## 2018-01-11 DIAGNOSIS — R410 Disorientation, unspecified: Secondary | ICD-10-CM | POA: Diagnosis not present

## 2018-01-11 DIAGNOSIS — R404 Transient alteration of awareness: Secondary | ICD-10-CM | POA: Diagnosis not present

## 2018-01-11 DIAGNOSIS — T17920A Food in respiratory tract, part unspecified causing asphyxiation, initial encounter: Secondary | ICD-10-CM | POA: Diagnosis not present

## 2018-01-11 DIAGNOSIS — R918 Other nonspecific abnormal finding of lung field: Secondary | ICD-10-CM | POA: Diagnosis not present

## 2018-01-11 DIAGNOSIS — F0390 Unspecified dementia without behavioral disturbance: Secondary | ICD-10-CM | POA: Diagnosis not present

## 2018-01-11 DIAGNOSIS — I4891 Unspecified atrial fibrillation: Secondary | ICD-10-CM | POA: Diagnosis not present

## 2018-01-11 DIAGNOSIS — R55 Syncope and collapse: Secondary | ICD-10-CM | POA: Diagnosis not present

## 2018-01-11 DIAGNOSIS — F17200 Nicotine dependence, unspecified, uncomplicated: Secondary | ICD-10-CM | POA: Diagnosis not present

## 2018-01-11 DIAGNOSIS — Z79899 Other long term (current) drug therapy: Secondary | ICD-10-CM | POA: Diagnosis not present

## 2018-04-07 DIAGNOSIS — G309 Alzheimer's disease, unspecified: Secondary | ICD-10-CM | POA: Diagnosis not present

## 2018-04-07 DIAGNOSIS — R32 Unspecified urinary incontinence: Secondary | ICD-10-CM | POA: Diagnosis not present

## 2018-04-13 DIAGNOSIS — M6281 Muscle weakness (generalized): Secondary | ICD-10-CM | POA: Diagnosis not present

## 2018-04-13 DIAGNOSIS — F0281 Dementia in other diseases classified elsewhere with behavioral disturbance: Secondary | ICD-10-CM | POA: Diagnosis not present

## 2018-04-13 DIAGNOSIS — R2689 Other abnormalities of gait and mobility: Secondary | ICD-10-CM | POA: Diagnosis not present

## 2018-04-17 DIAGNOSIS — M6281 Muscle weakness (generalized): Secondary | ICD-10-CM | POA: Diagnosis not present

## 2018-04-17 DIAGNOSIS — R2689 Other abnormalities of gait and mobility: Secondary | ICD-10-CM | POA: Diagnosis not present

## 2018-04-17 DIAGNOSIS — F0281 Dementia in other diseases classified elsewhere with behavioral disturbance: Secondary | ICD-10-CM | POA: Diagnosis not present

## 2018-04-18 DIAGNOSIS — R2689 Other abnormalities of gait and mobility: Secondary | ICD-10-CM | POA: Diagnosis not present

## 2018-04-18 DIAGNOSIS — F0281 Dementia in other diseases classified elsewhere with behavioral disturbance: Secondary | ICD-10-CM | POA: Diagnosis not present

## 2018-04-18 DIAGNOSIS — M6281 Muscle weakness (generalized): Secondary | ICD-10-CM | POA: Diagnosis not present

## 2018-04-19 DIAGNOSIS — M6281 Muscle weakness (generalized): Secondary | ICD-10-CM | POA: Diagnosis not present

## 2018-04-19 DIAGNOSIS — F0281 Dementia in other diseases classified elsewhere with behavioral disturbance: Secondary | ICD-10-CM | POA: Diagnosis not present

## 2018-04-19 DIAGNOSIS — R2689 Other abnormalities of gait and mobility: Secondary | ICD-10-CM | POA: Diagnosis not present

## 2018-04-20 DIAGNOSIS — M6281 Muscle weakness (generalized): Secondary | ICD-10-CM | POA: Diagnosis not present

## 2018-04-20 DIAGNOSIS — R2689 Other abnormalities of gait and mobility: Secondary | ICD-10-CM | POA: Diagnosis not present

## 2018-04-20 DIAGNOSIS — F0281 Dementia in other diseases classified elsewhere with behavioral disturbance: Secondary | ICD-10-CM | POA: Diagnosis not present

## 2018-04-23 DIAGNOSIS — M6281 Muscle weakness (generalized): Secondary | ICD-10-CM | POA: Diagnosis not present

## 2018-04-23 DIAGNOSIS — R41841 Cognitive communication deficit: Secondary | ICD-10-CM | POA: Diagnosis not present

## 2018-04-23 DIAGNOSIS — G301 Alzheimer's disease with late onset: Secondary | ICD-10-CM | POA: Diagnosis not present

## 2018-04-23 DIAGNOSIS — R2689 Other abnormalities of gait and mobility: Secondary | ICD-10-CM | POA: Diagnosis not present

## 2018-04-23 DIAGNOSIS — F0281 Dementia in other diseases classified elsewhere with behavioral disturbance: Secondary | ICD-10-CM | POA: Diagnosis not present

## 2018-04-24 DIAGNOSIS — F0281 Dementia in other diseases classified elsewhere with behavioral disturbance: Secondary | ICD-10-CM | POA: Diagnosis not present

## 2018-04-24 DIAGNOSIS — R2689 Other abnormalities of gait and mobility: Secondary | ICD-10-CM | POA: Diagnosis not present

## 2018-04-24 DIAGNOSIS — M6281 Muscle weakness (generalized): Secondary | ICD-10-CM | POA: Diagnosis not present

## 2018-04-24 DIAGNOSIS — G301 Alzheimer's disease with late onset: Secondary | ICD-10-CM | POA: Diagnosis not present

## 2018-04-24 DIAGNOSIS — R41841 Cognitive communication deficit: Secondary | ICD-10-CM | POA: Diagnosis not present

## 2018-04-25 DIAGNOSIS — G301 Alzheimer's disease with late onset: Secondary | ICD-10-CM | POA: Diagnosis not present

## 2018-04-25 DIAGNOSIS — M6281 Muscle weakness (generalized): Secondary | ICD-10-CM | POA: Diagnosis not present

## 2018-04-25 DIAGNOSIS — R2689 Other abnormalities of gait and mobility: Secondary | ICD-10-CM | POA: Diagnosis not present

## 2018-04-25 DIAGNOSIS — R41841 Cognitive communication deficit: Secondary | ICD-10-CM | POA: Diagnosis not present

## 2018-04-25 DIAGNOSIS — F0281 Dementia in other diseases classified elsewhere with behavioral disturbance: Secondary | ICD-10-CM | POA: Diagnosis not present

## 2018-04-26 DIAGNOSIS — F0281 Dementia in other diseases classified elsewhere with behavioral disturbance: Secondary | ICD-10-CM | POA: Diagnosis not present

## 2018-04-26 DIAGNOSIS — G301 Alzheimer's disease with late onset: Secondary | ICD-10-CM | POA: Diagnosis not present

## 2018-04-26 DIAGNOSIS — R41841 Cognitive communication deficit: Secondary | ICD-10-CM | POA: Diagnosis not present

## 2018-04-26 DIAGNOSIS — R2689 Other abnormalities of gait and mobility: Secondary | ICD-10-CM | POA: Diagnosis not present

## 2018-04-26 DIAGNOSIS — M6281 Muscle weakness (generalized): Secondary | ICD-10-CM | POA: Diagnosis not present

## 2018-04-27 DIAGNOSIS — F0281 Dementia in other diseases classified elsewhere with behavioral disturbance: Secondary | ICD-10-CM | POA: Diagnosis not present

## 2018-04-27 DIAGNOSIS — R41841 Cognitive communication deficit: Secondary | ICD-10-CM | POA: Diagnosis not present

## 2018-04-27 DIAGNOSIS — R2689 Other abnormalities of gait and mobility: Secondary | ICD-10-CM | POA: Diagnosis not present

## 2018-04-27 DIAGNOSIS — M6281 Muscle weakness (generalized): Secondary | ICD-10-CM | POA: Diagnosis not present

## 2018-04-27 DIAGNOSIS — G301 Alzheimer's disease with late onset: Secondary | ICD-10-CM | POA: Diagnosis not present

## 2018-04-30 DIAGNOSIS — R2689 Other abnormalities of gait and mobility: Secondary | ICD-10-CM | POA: Diagnosis not present

## 2018-04-30 DIAGNOSIS — R41841 Cognitive communication deficit: Secondary | ICD-10-CM | POA: Diagnosis not present

## 2018-04-30 DIAGNOSIS — F0281 Dementia in other diseases classified elsewhere with behavioral disturbance: Secondary | ICD-10-CM | POA: Diagnosis not present

## 2018-04-30 DIAGNOSIS — G301 Alzheimer's disease with late onset: Secondary | ICD-10-CM | POA: Diagnosis not present

## 2018-04-30 DIAGNOSIS — M6281 Muscle weakness (generalized): Secondary | ICD-10-CM | POA: Diagnosis not present

## 2018-05-01 DIAGNOSIS — R2689 Other abnormalities of gait and mobility: Secondary | ICD-10-CM | POA: Diagnosis not present

## 2018-05-01 DIAGNOSIS — G301 Alzheimer's disease with late onset: Secondary | ICD-10-CM | POA: Diagnosis not present

## 2018-05-01 DIAGNOSIS — F0281 Dementia in other diseases classified elsewhere with behavioral disturbance: Secondary | ICD-10-CM | POA: Diagnosis not present

## 2018-05-01 DIAGNOSIS — R41841 Cognitive communication deficit: Secondary | ICD-10-CM | POA: Diagnosis not present

## 2018-05-01 DIAGNOSIS — M6281 Muscle weakness (generalized): Secondary | ICD-10-CM | POA: Diagnosis not present

## 2018-05-02 DIAGNOSIS — M6281 Muscle weakness (generalized): Secondary | ICD-10-CM | POA: Diagnosis not present

## 2018-05-02 DIAGNOSIS — F0281 Dementia in other diseases classified elsewhere with behavioral disturbance: Secondary | ICD-10-CM | POA: Diagnosis not present

## 2018-05-02 DIAGNOSIS — R2689 Other abnormalities of gait and mobility: Secondary | ICD-10-CM | POA: Diagnosis not present

## 2018-05-02 DIAGNOSIS — G301 Alzheimer's disease with late onset: Secondary | ICD-10-CM | POA: Diagnosis not present

## 2018-05-02 DIAGNOSIS — R41841 Cognitive communication deficit: Secondary | ICD-10-CM | POA: Diagnosis not present

## 2018-05-03 DIAGNOSIS — R41841 Cognitive communication deficit: Secondary | ICD-10-CM | POA: Diagnosis not present

## 2018-05-03 DIAGNOSIS — G301 Alzheimer's disease with late onset: Secondary | ICD-10-CM | POA: Diagnosis not present

## 2018-05-03 DIAGNOSIS — M6281 Muscle weakness (generalized): Secondary | ICD-10-CM | POA: Diagnosis not present

## 2018-05-03 DIAGNOSIS — F0281 Dementia in other diseases classified elsewhere with behavioral disturbance: Secondary | ICD-10-CM | POA: Diagnosis not present

## 2018-05-03 DIAGNOSIS — R2689 Other abnormalities of gait and mobility: Secondary | ICD-10-CM | POA: Diagnosis not present

## 2018-05-04 DIAGNOSIS — F0280 Dementia in other diseases classified elsewhere without behavioral disturbance: Secondary | ICD-10-CM | POA: Diagnosis not present

## 2018-05-06 DIAGNOSIS — F17219 Nicotine dependence, cigarettes, with unspecified nicotine-induced disorders: Secondary | ICD-10-CM | POA: Diagnosis not present

## 2018-05-06 DIAGNOSIS — E441 Mild protein-calorie malnutrition: Secondary | ICD-10-CM | POA: Diagnosis not present

## 2018-05-06 DIAGNOSIS — K5900 Constipation, unspecified: Secondary | ICD-10-CM | POA: Diagnosis not present

## 2018-05-06 DIAGNOSIS — G309 Alzheimer's disease, unspecified: Secondary | ICD-10-CM | POA: Diagnosis not present

## 2018-05-06 DIAGNOSIS — F0280 Dementia in other diseases classified elsewhere without behavioral disturbance: Secondary | ICD-10-CM | POA: Diagnosis not present

## 2018-05-08 DIAGNOSIS — G309 Alzheimer's disease, unspecified: Secondary | ICD-10-CM | POA: Diagnosis not present

## 2018-05-08 DIAGNOSIS — E441 Mild protein-calorie malnutrition: Secondary | ICD-10-CM | POA: Diagnosis not present

## 2018-05-08 DIAGNOSIS — K5900 Constipation, unspecified: Secondary | ICD-10-CM | POA: Diagnosis not present

## 2018-05-08 DIAGNOSIS — F0280 Dementia in other diseases classified elsewhere without behavioral disturbance: Secondary | ICD-10-CM | POA: Diagnosis not present

## 2018-05-08 DIAGNOSIS — F17219 Nicotine dependence, cigarettes, with unspecified nicotine-induced disorders: Secondary | ICD-10-CM | POA: Diagnosis not present

## 2018-05-09 DIAGNOSIS — Q210 Ventricular septal defect: Secondary | ICD-10-CM | POA: Diagnosis not present

## 2018-05-09 DIAGNOSIS — R4182 Altered mental status, unspecified: Secondary | ICD-10-CM | POA: Diagnosis not present

## 2018-05-09 DIAGNOSIS — G309 Alzheimer's disease, unspecified: Secondary | ICD-10-CM | POA: Diagnosis not present

## 2018-05-09 DIAGNOSIS — E441 Mild protein-calorie malnutrition: Secondary | ICD-10-CM | POA: Diagnosis not present

## 2018-05-09 DIAGNOSIS — R52 Pain, unspecified: Secondary | ICD-10-CM | POA: Diagnosis not present

## 2018-05-09 DIAGNOSIS — N390 Urinary tract infection, site not specified: Secondary | ICD-10-CM | POA: Diagnosis not present

## 2018-05-09 DIAGNOSIS — R410 Disorientation, unspecified: Secondary | ICD-10-CM | POA: Diagnosis not present

## 2018-05-09 DIAGNOSIS — Z79899 Other long term (current) drug therapy: Secondary | ICD-10-CM | POA: Diagnosis not present

## 2018-05-09 DIAGNOSIS — R456 Violent behavior: Secondary | ICD-10-CM | POA: Diagnosis not present

## 2018-05-09 DIAGNOSIS — F0391 Unspecified dementia with behavioral disturbance: Secondary | ICD-10-CM | POA: Diagnosis not present

## 2018-05-09 DIAGNOSIS — F17200 Nicotine dependence, unspecified, uncomplicated: Secondary | ICD-10-CM | POA: Diagnosis not present

## 2018-05-09 DIAGNOSIS — G9389 Other specified disorders of brain: Secondary | ICD-10-CM | POA: Diagnosis not present

## 2018-05-09 DIAGNOSIS — F17219 Nicotine dependence, cigarettes, with unspecified nicotine-induced disorders: Secondary | ICD-10-CM | POA: Diagnosis not present

## 2018-05-09 DIAGNOSIS — I959 Hypotension, unspecified: Secondary | ICD-10-CM | POA: Diagnosis not present

## 2018-05-09 DIAGNOSIS — Z886 Allergy status to analgesic agent status: Secondary | ICD-10-CM | POA: Diagnosis not present

## 2018-05-09 DIAGNOSIS — F0280 Dementia in other diseases classified elsewhere without behavioral disturbance: Secondary | ICD-10-CM | POA: Diagnosis not present

## 2018-05-09 DIAGNOSIS — Z881 Allergy status to other antibiotic agents status: Secondary | ICD-10-CM | POA: Diagnosis not present

## 2018-05-09 DIAGNOSIS — K5900 Constipation, unspecified: Secondary | ICD-10-CM | POA: Diagnosis not present

## 2018-05-10 DIAGNOSIS — R5381 Other malaise: Secondary | ICD-10-CM | POA: Diagnosis not present

## 2018-05-10 DIAGNOSIS — R279 Unspecified lack of coordination: Secondary | ICD-10-CM | POA: Diagnosis not present

## 2018-05-10 DIAGNOSIS — Z7401 Bed confinement status: Secondary | ICD-10-CM | POA: Diagnosis not present

## 2018-05-11 DIAGNOSIS — F0281 Dementia in other diseases classified elsewhere with behavioral disturbance: Secondary | ICD-10-CM | POA: Diagnosis not present

## 2018-05-11 DIAGNOSIS — F0390 Unspecified dementia without behavioral disturbance: Secondary | ICD-10-CM | POA: Diagnosis not present

## 2018-05-11 DIAGNOSIS — R41841 Cognitive communication deficit: Secondary | ICD-10-CM | POA: Diagnosis not present

## 2018-05-11 DIAGNOSIS — K5900 Constipation, unspecified: Secondary | ICD-10-CM | POA: Diagnosis not present

## 2018-05-11 DIAGNOSIS — M6281 Muscle weakness (generalized): Secondary | ICD-10-CM | POA: Diagnosis not present

## 2018-05-11 DIAGNOSIS — G301 Alzheimer's disease with late onset: Secondary | ICD-10-CM | POA: Diagnosis not present

## 2018-05-11 DIAGNOSIS — N390 Urinary tract infection, site not specified: Secondary | ICD-10-CM | POA: Diagnosis not present

## 2018-05-11 DIAGNOSIS — R2689 Other abnormalities of gait and mobility: Secondary | ICD-10-CM | POA: Diagnosis not present

## 2018-05-18 DIAGNOSIS — E441 Mild protein-calorie malnutrition: Secondary | ICD-10-CM | POA: Diagnosis not present

## 2018-05-18 DIAGNOSIS — F17219 Nicotine dependence, cigarettes, with unspecified nicotine-induced disorders: Secondary | ICD-10-CM | POA: Diagnosis not present

## 2018-05-18 DIAGNOSIS — G309 Alzheimer's disease, unspecified: Secondary | ICD-10-CM | POA: Diagnosis not present

## 2018-05-18 DIAGNOSIS — F0280 Dementia in other diseases classified elsewhere without behavioral disturbance: Secondary | ICD-10-CM | POA: Diagnosis not present

## 2018-05-18 DIAGNOSIS — K5900 Constipation, unspecified: Secondary | ICD-10-CM | POA: Diagnosis not present

## 2018-06-02 DIAGNOSIS — F0390 Unspecified dementia without behavioral disturbance: Secondary | ICD-10-CM | POA: Diagnosis not present

## 2018-06-02 DIAGNOSIS — K5900 Constipation, unspecified: Secondary | ICD-10-CM | POA: Diagnosis not present

## 2018-06-07 ENCOUNTER — Other Ambulatory Visit: Payer: Self-pay | Admitting: *Deleted

## 2018-06-07 NOTE — Patient Outreach (Signed)
[facility]) Care Management  06/07/2018  Edra Biggins 08/14/1940 [id number]    Member was identified via [place] of admission to [facility].   After further follow up, [facility] confirmed member is a long term resident at facility.  Plan to sign off. No identifiable [facility] Care Management needs.   Raiford Noble, MSN-Ed, RN,BSN [facility] Post Acute Care Coordinator [phone]

## 2018-07-29 DIAGNOSIS — K5900 Constipation, unspecified: Secondary | ICD-10-CM | POA: Diagnosis not present

## 2018-08-16 DIAGNOSIS — R1312 Dysphagia, oropharyngeal phase: Secondary | ICD-10-CM | POA: Diagnosis not present

## 2018-08-16 DIAGNOSIS — G309 Alzheimer's disease, unspecified: Secondary | ICD-10-CM | POA: Diagnosis not present

## 2018-08-17 DIAGNOSIS — G309 Alzheimer's disease, unspecified: Secondary | ICD-10-CM | POA: Diagnosis not present

## 2018-08-17 DIAGNOSIS — R1312 Dysphagia, oropharyngeal phase: Secondary | ICD-10-CM | POA: Diagnosis not present

## 2018-08-21 DIAGNOSIS — G309 Alzheimer's disease, unspecified: Secondary | ICD-10-CM | POA: Diagnosis not present

## 2018-08-21 DIAGNOSIS — R1312 Dysphagia, oropharyngeal phase: Secondary | ICD-10-CM | POA: Diagnosis not present

## 2018-08-22 DIAGNOSIS — R1312 Dysphagia, oropharyngeal phase: Secondary | ICD-10-CM | POA: Diagnosis not present

## 2018-08-22 DIAGNOSIS — G309 Alzheimer's disease, unspecified: Secondary | ICD-10-CM | POA: Diagnosis not present

## 2018-08-24 DIAGNOSIS — G309 Alzheimer's disease, unspecified: Secondary | ICD-10-CM | POA: Diagnosis not present

## 2018-08-24 DIAGNOSIS — R1312 Dysphagia, oropharyngeal phase: Secondary | ICD-10-CM | POA: Diagnosis not present

## 2018-08-27 DIAGNOSIS — G309 Alzheimer's disease, unspecified: Secondary | ICD-10-CM | POA: Diagnosis not present

## 2018-08-27 DIAGNOSIS — R1312 Dysphagia, oropharyngeal phase: Secondary | ICD-10-CM | POA: Diagnosis not present

## 2018-08-28 DIAGNOSIS — R1312 Dysphagia, oropharyngeal phase: Secondary | ICD-10-CM | POA: Diagnosis not present

## 2018-08-28 DIAGNOSIS — G309 Alzheimer's disease, unspecified: Secondary | ICD-10-CM | POA: Diagnosis not present

## 2018-08-29 DIAGNOSIS — G309 Alzheimer's disease, unspecified: Secondary | ICD-10-CM | POA: Diagnosis not present

## 2018-08-29 DIAGNOSIS — R1312 Dysphagia, oropharyngeal phase: Secondary | ICD-10-CM | POA: Diagnosis not present

## 2018-08-30 DIAGNOSIS — R1312 Dysphagia, oropharyngeal phase: Secondary | ICD-10-CM | POA: Diagnosis not present

## 2018-08-30 DIAGNOSIS — G309 Alzheimer's disease, unspecified: Secondary | ICD-10-CM | POA: Diagnosis not present

## 2018-08-31 DIAGNOSIS — R1312 Dysphagia, oropharyngeal phase: Secondary | ICD-10-CM | POA: Diagnosis not present

## 2018-08-31 DIAGNOSIS — G309 Alzheimer's disease, unspecified: Secondary | ICD-10-CM | POA: Diagnosis not present

## 2018-09-03 DIAGNOSIS — R1312 Dysphagia, oropharyngeal phase: Secondary | ICD-10-CM | POA: Diagnosis not present

## 2018-09-03 DIAGNOSIS — G309 Alzheimer's disease, unspecified: Secondary | ICD-10-CM | POA: Diagnosis not present

## 2018-09-04 DIAGNOSIS — G309 Alzheimer's disease, unspecified: Secondary | ICD-10-CM | POA: Diagnosis not present

## 2018-09-04 DIAGNOSIS — R1312 Dysphagia, oropharyngeal phase: Secondary | ICD-10-CM | POA: Diagnosis not present

## 2018-09-05 DIAGNOSIS — R1312 Dysphagia, oropharyngeal phase: Secondary | ICD-10-CM | POA: Diagnosis not present

## 2018-09-05 DIAGNOSIS — G309 Alzheimer's disease, unspecified: Secondary | ICD-10-CM | POA: Diagnosis not present

## 2018-09-06 DIAGNOSIS — R1312 Dysphagia, oropharyngeal phase: Secondary | ICD-10-CM | POA: Diagnosis not present

## 2018-09-06 DIAGNOSIS — G309 Alzheimer's disease, unspecified: Secondary | ICD-10-CM | POA: Diagnosis not present

## 2018-09-07 DIAGNOSIS — G309 Alzheimer's disease, unspecified: Secondary | ICD-10-CM | POA: Diagnosis not present

## 2018-09-07 DIAGNOSIS — R1312 Dysphagia, oropharyngeal phase: Secondary | ICD-10-CM | POA: Diagnosis not present

## 2018-09-23 DIAGNOSIS — F0390 Unspecified dementia without behavioral disturbance: Secondary | ICD-10-CM | POA: Diagnosis not present

## 2018-09-23 DIAGNOSIS — K5900 Constipation, unspecified: Secondary | ICD-10-CM | POA: Diagnosis not present

## 2018-10-31 DIAGNOSIS — Z20828 Contact with and (suspected) exposure to other viral communicable diseases: Secondary | ICD-10-CM | POA: Diagnosis not present

## 2018-11-07 DIAGNOSIS — Z20828 Contact with and (suspected) exposure to other viral communicable diseases: Secondary | ICD-10-CM | POA: Diagnosis not present

## 2018-11-14 DIAGNOSIS — Z20828 Contact with and (suspected) exposure to other viral communicable diseases: Secondary | ICD-10-CM | POA: Diagnosis not present

## 2018-11-18 DIAGNOSIS — F0390 Unspecified dementia without behavioral disturbance: Secondary | ICD-10-CM | POA: Diagnosis not present

## 2018-11-18 DIAGNOSIS — K5900 Constipation, unspecified: Secondary | ICD-10-CM | POA: Diagnosis not present

## 2018-11-20 DIAGNOSIS — Z20828 Contact with and (suspected) exposure to other viral communicable diseases: Secondary | ICD-10-CM | POA: Diagnosis not present

## 2018-11-22 DIAGNOSIS — Z20828 Contact with and (suspected) exposure to other viral communicable diseases: Secondary | ICD-10-CM | POA: Diagnosis not present

## 2018-11-27 DIAGNOSIS — Z23 Encounter for immunization: Secondary | ICD-10-CM | POA: Diagnosis not present

## 2018-11-27 DIAGNOSIS — Z20828 Contact with and (suspected) exposure to other viral communicable diseases: Secondary | ICD-10-CM | POA: Diagnosis not present

## 2018-12-04 DIAGNOSIS — Z20828 Contact with and (suspected) exposure to other viral communicable diseases: Secondary | ICD-10-CM | POA: Diagnosis not present

## 2018-12-11 DIAGNOSIS — Z20828 Contact with and (suspected) exposure to other viral communicable diseases: Secondary | ICD-10-CM | POA: Diagnosis not present

## 2018-12-18 DIAGNOSIS — Z20828 Contact with and (suspected) exposure to other viral communicable diseases: Secondary | ICD-10-CM | POA: Diagnosis not present

## 2018-12-25 DIAGNOSIS — Z20828 Contact with and (suspected) exposure to other viral communicable diseases: Secondary | ICD-10-CM | POA: Diagnosis not present

## 2019-01-01 DIAGNOSIS — Z20828 Contact with and (suspected) exposure to other viral communicable diseases: Secondary | ICD-10-CM | POA: Diagnosis not present

## 2019-01-08 DIAGNOSIS — Z20828 Contact with and (suspected) exposure to other viral communicable diseases: Secondary | ICD-10-CM | POA: Diagnosis not present

## 2019-01-12 DIAGNOSIS — K5900 Constipation, unspecified: Secondary | ICD-10-CM | POA: Diagnosis not present

## 2019-01-12 DIAGNOSIS — F0390 Unspecified dementia without behavioral disturbance: Secondary | ICD-10-CM | POA: Diagnosis not present

## 2019-01-14 DIAGNOSIS — Z20828 Contact with and (suspected) exposure to other viral communicable diseases: Secondary | ICD-10-CM | POA: Diagnosis not present

## 2019-02-20 DIAGNOSIS — E860 Dehydration: Secondary | ICD-10-CM | POA: Diagnosis not present

## 2019-02-20 DIAGNOSIS — N179 Acute kidney failure, unspecified: Secondary | ICD-10-CM | POA: Diagnosis not present

## 2019-02-23 DIAGNOSIS — M6281 Muscle weakness (generalized): Secondary | ICD-10-CM | POA: Diagnosis not present

## 2019-02-23 DIAGNOSIS — D649 Anemia, unspecified: Secondary | ICD-10-CM | POA: Diagnosis not present

## 2019-03-25 DEATH — deceased

## 2019-04-24 IMAGING — MR MR HEAD WO/W CM
9 of 13 series · 28 of 48 positions shown · IV contrast (9ml Multihance)
Comparison: CT 03/29/2017.  MRI 05/13/2016.

CLINICAL DATA: Weakness and altered mental status over the last 3
weeks.

EXAM:
MRI HEAD WITHOUT AND WITH CONTRAST
TECHNIQUE: Multiplanar, multiecho pulse sequences of the brain and surrounding
structures were obtained without and with intravenous contrast.
CONTRAST:  9mL MULTIHANCE GADOBENATE DIMEGLUMINE 529 MG/ML IV SOLN

[Series 3: DWI · axial · 3.0mm · 0.71mm/px · z∈[-90,+68]mm · 5 of 54 slices shown (1 of 4)]
[im 1/54]
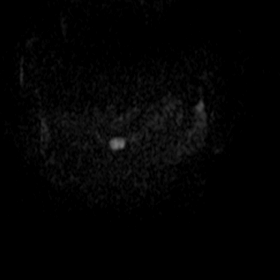
[im 14/54]
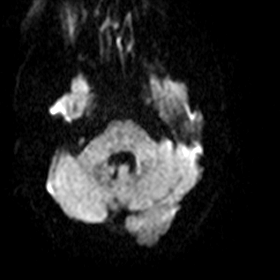
[im 27/54]
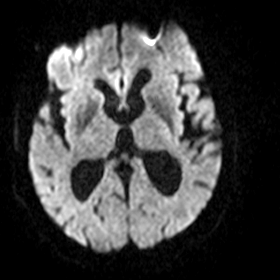
[im 40/54]
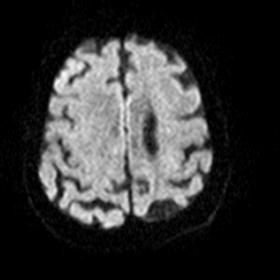
[im 54/54]
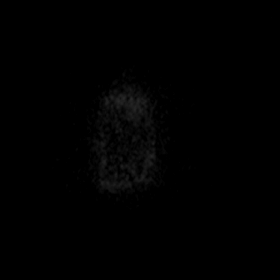

[Series 4: DWI · axial · 3.0mm · 0.73mm/px · z∈[-92,+70]mm · 5 of 55 slices shown (2 of 4)]
[im 1/55]
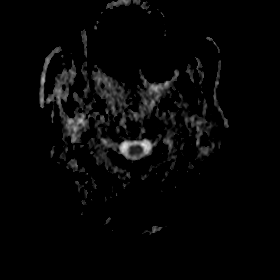
[im 14/55]
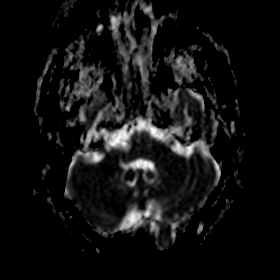
[im 28/55]
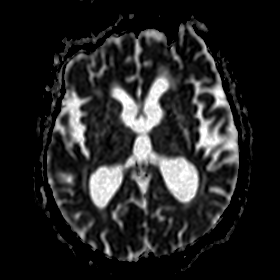
[im 41/55]
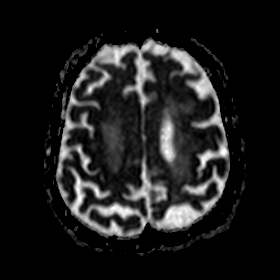
[im 55/55]
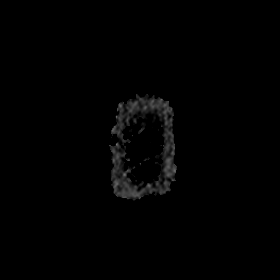

[Series 5: DWI · coronal · 5.0mm · 0.47mm/px · 3 of 34 slices shown (3 of 4)]
[im 1/34]
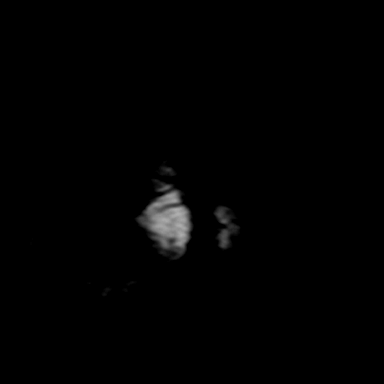
[im 17/34]
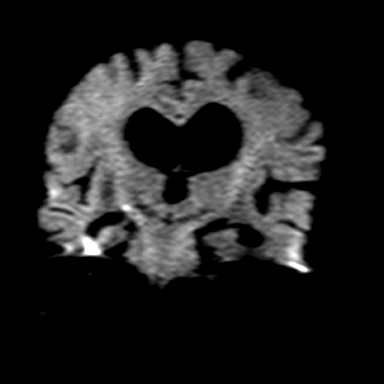
[im 34/34]
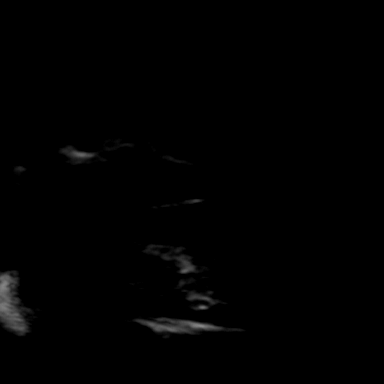

[Series 6: DWI · coronal · 5.0mm · 0.48mm/px · 3 of 34 slices shown (4 of 4)]
[im 1/34]
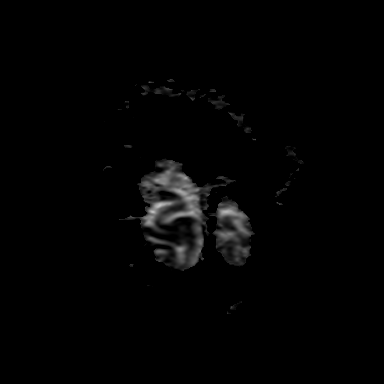
[im 17/34]
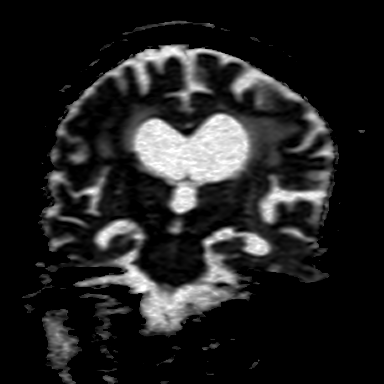
[im 34/34]
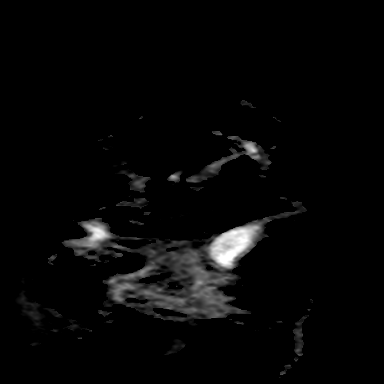

[Series 7: T2 · axial · 5.0mm · 0.69mm/px · z∈[-83,+60]mm · 2 of 23 slices shown (1 of 3)]
[im 1/23]
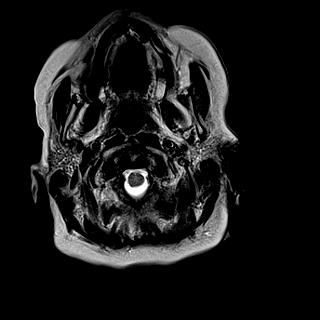
[im 23/23]
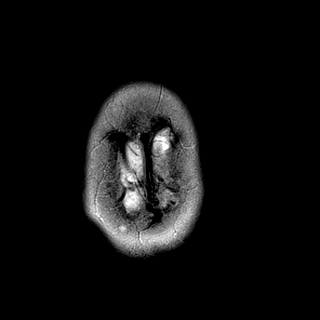

[Series 8: FLAIR · axial · 3.0mm · 0.87mm/px · z∈[-80,+57]mm · 4 of 47 slices shown]
[im 1/47]
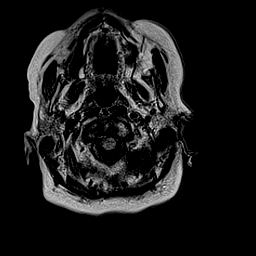
[im 16/47]
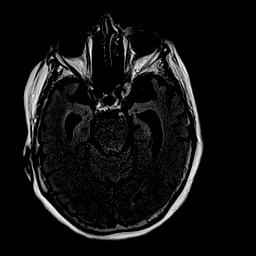
[im 31/47]
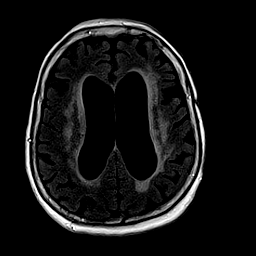
[im 47/47]
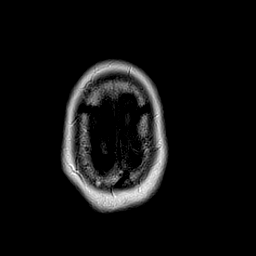

[Series 11: T2 · coronal · 3.0mm · 0.21mm/px · 3 of 36 slices shown (2 of 3)]
[im 1/36]
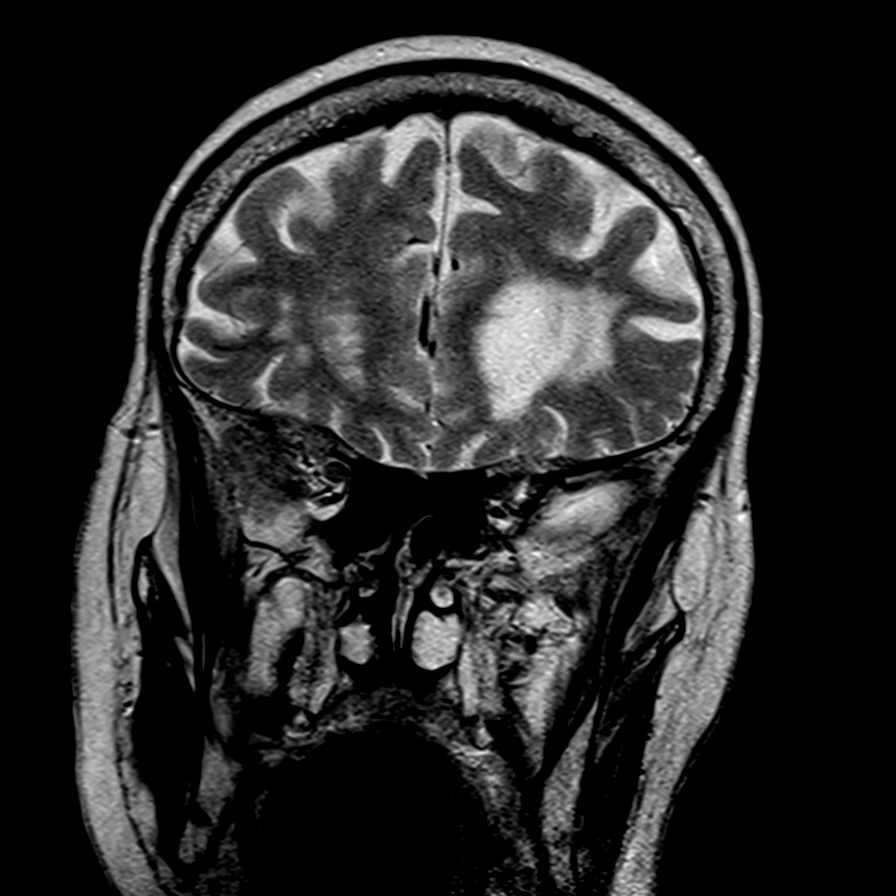
[im 18/36]
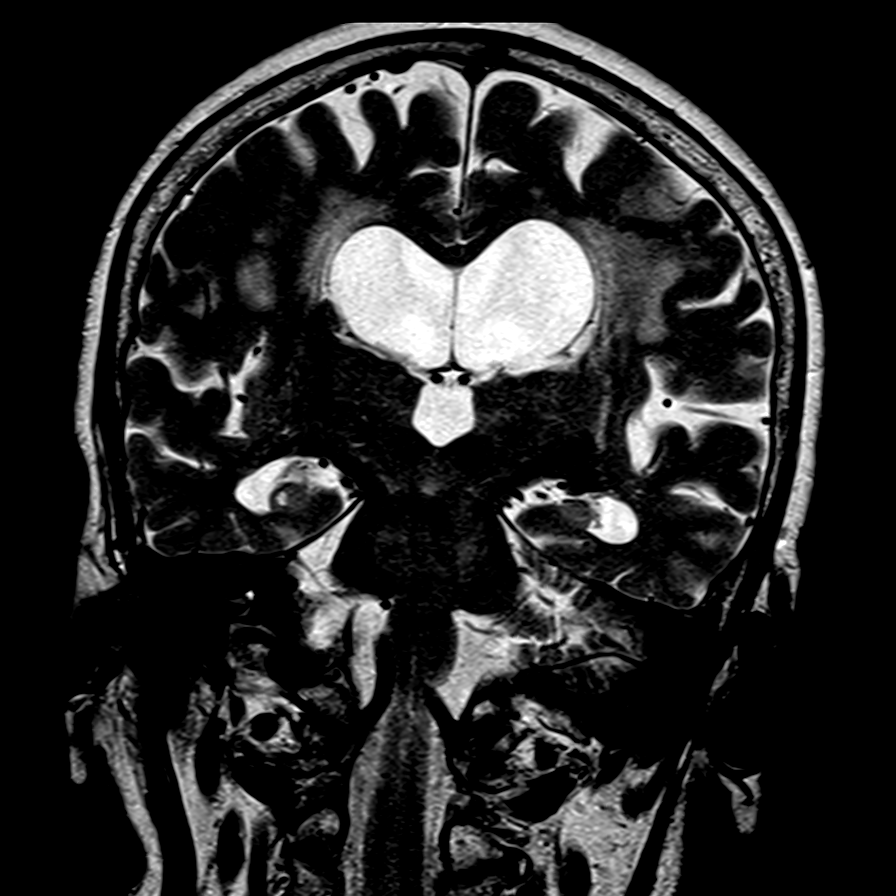
[im 36/36]
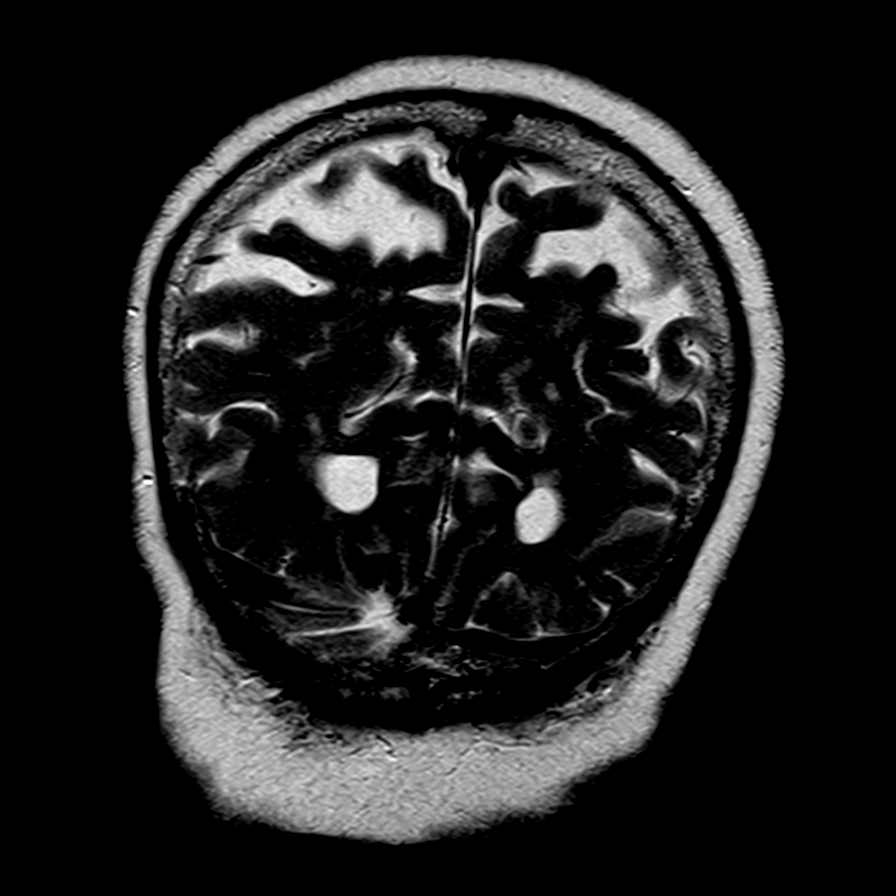

[Series 13: T2 · coronal · 5.0mm · 0.61mm/px · 1 of 28 slices shown (3 of 3)]
[im 1/28]
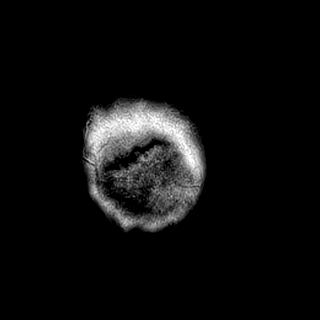

[Series 15: T1 post-contrast · coronal · 5.0mm · 0.40mm/px · 2 of 24 slices shown]
[im 1/24]
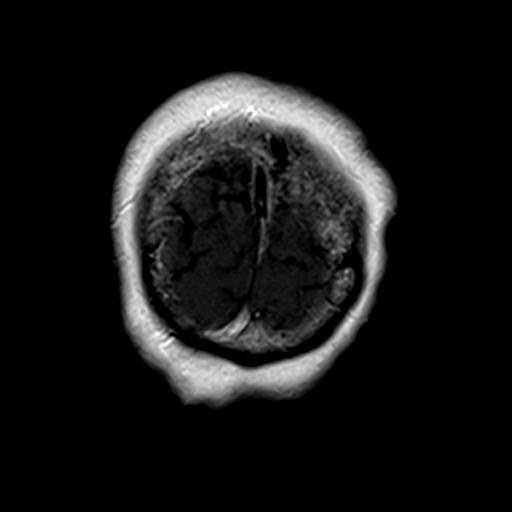
[im 24/24]
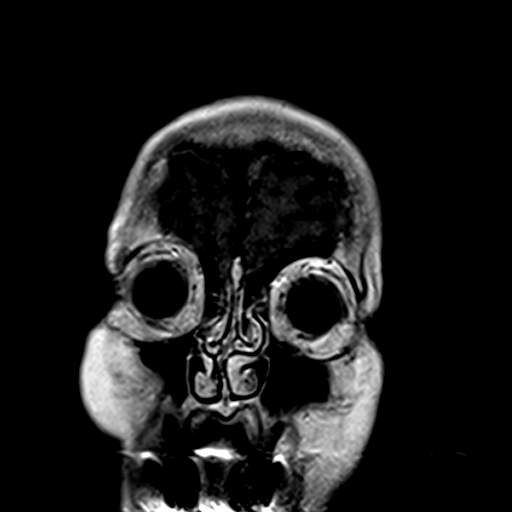

[28 of 48 positions shown; findings below may reference images not displayed]

FINDINGS: Brain: Diffusion imaging does not show any acute or subacute
infarction. Extensive chronic small-vessel ischemic changes affect
the pons. No focal cerebellar insult. Cerebral hemispheres show
atrophy and chronic small-vessel ischemic changes throughout the
deep more than subcortical white matter, with ex vacuo enlargement
of the lateral ventricles. Normal pressure hydrocephalus not
excluded but not favored. No evidence of mass lesion, hemorrhage,
hydrocephalus or extra-axial collection. No focal mesial temporal
lesion. After contrast administration, no abnormal enhancement
occurs.

Vascular: Major vessels at the base of the brain show flow.

Skull and upper cervical spine: Negative

Sinuses/Orbits: Clear/normal

Other: None
IMPRESSION: No acute or reversible finding. Chronic atrophy, widespread
small-vessel ischemic changes and ventriculomegaly. Normal pressure
hydrocephalus is not excluded but not favored. Ventriculomegaly more
likely due to central atrophy.
